# Patient Record
Sex: Female | Born: 1995 | Race: White | Hispanic: No | Marital: Married | Smoking: Never smoker
Health system: Southern US, Community
[De-identification: ages and names within clinical notes are randomized; demographics above are authoritative.]

## PROBLEM LIST (undated history)

## (undated) DIAGNOSIS — D649 Anemia, unspecified: Secondary | ICD-10-CM

## (undated) DIAGNOSIS — F419 Anxiety disorder, unspecified: Secondary | ICD-10-CM

## (undated) DIAGNOSIS — K219 Gastro-esophageal reflux disease without esophagitis: Secondary | ICD-10-CM

## (undated) HISTORY — PX: FOOT SURGERY: SHX648

## (undated) HISTORY — DX: Anxiety disorder, unspecified: F41.9

## (undated) HISTORY — DX: Gastro-esophageal reflux disease without esophagitis: K21.9

---

## 2017-12-23 DIAGNOSIS — O211 Hyperemesis gravidarum with metabolic disturbance: Secondary | ICD-10-CM | POA: Insufficient documentation

## 2018-01-01 DIAGNOSIS — Z8759 Personal history of other complications of pregnancy, childbirth and the puerperium: Secondary | ICD-10-CM | POA: Insufficient documentation

## 2020-02-24 DIAGNOSIS — N750 Cyst of Bartholin's gland: Secondary | ICD-10-CM | POA: Insufficient documentation

## 2020-12-06 ENCOUNTER — Other Ambulatory Visit: Payer: Self-pay

## 2021-11-25 ENCOUNTER — Emergency Department: Payer: Medicaid Other

## 2021-11-25 ENCOUNTER — Encounter: Payer: Self-pay | Admitting: Emergency Medicine

## 2021-11-25 ENCOUNTER — Emergency Department
Admission: EM | Admit: 2021-11-25 | Discharge: 2021-11-25 | Disposition: A | Discharge status: Home / Self Care | Payer: Medicaid Other | Attending: Student in an Organized Health Care Education/Training Program | Admitting: Student in an Organized Health Care Education/Training Program

## 2021-11-25 ENCOUNTER — Other Ambulatory Visit: Payer: Self-pay

## 2021-11-25 DIAGNOSIS — O219 Vomiting of pregnancy, unspecified: Secondary | ICD-10-CM | POA: Diagnosis not present

## 2021-11-25 DIAGNOSIS — E860 Dehydration: Secondary | ICD-10-CM | POA: Insufficient documentation

## 2021-11-25 DIAGNOSIS — Z3A01 Less than 8 weeks gestation of pregnancy: Secondary | ICD-10-CM | POA: Insufficient documentation

## 2021-11-25 HISTORY — DX: Anemia, unspecified: D64.9

## 2021-11-25 LAB — COMPREHENSIVE METABOLIC PANEL
ALT: 12 U/L (ref 0–44)
AST: 12 U/L — ABNORMAL LOW (ref 15–41)
Albumin: 4 g/dL (ref 3.5–5.0)
Alkaline Phosphatase: 46 U/L (ref 38–126)
Anion gap: 7 (ref 5–15)
BUN: 11 mg/dL (ref 6–20)
CO2: 23 mmol/L (ref 22–32)
Calcium: 8.9 mg/dL (ref 8.9–10.3)
Chloride: 101 mmol/L (ref 98–111)
Creatinine, Ser: 0.52 mg/dL (ref 0.44–1.00)
GFR, Estimated: >60 mL/min (ref >60)
Glucose, Bld: 94 mg/dL (ref 70–99)
Potassium: 3.8 mmol/L (ref 3.5–5.1)
Sodium: 131 mmol/L — ABNORMAL LOW (ref 135–145)
Total Bilirubin: 0.8 mg/dL (ref 0.3–1.2)
Total Protein: 7.3 g/dL (ref 6.5–8.1)

## 2021-11-25 LAB — URINALYSIS, ROUTINE W REFLEX MICROSCOPIC
Bacteria, UA: NONE SEEN
Bilirubin Urine: NEGATIVE
Glucose, UA: NEGATIVE mg/dL
Hgb urine dipstick: NEGATIVE
Ketones, ur: NEGATIVE mg/dL
Leukocytes,Ua: NEGATIVE
Nitrite: NEGATIVE
Protein, ur: NEGATIVE mg/dL
Specific Gravity, Urine: 1.023 (ref 1.005–1.030)
pH: 6 (ref 5.0–8.0)

## 2021-11-25 LAB — CBC
HCT: 35.3 % — ABNORMAL LOW (ref 36.0–46.0)
Hemoglobin: 11.7 g/dL — ABNORMAL LOW (ref 12.0–15.0)
MCH: 29 pg (ref 26.0–34.0)
MCHC: 33.1 g/dL (ref 30.0–36.0)
MCV: 87.6 fL (ref 80.0–100.0)
Platelets: 248 10*3/uL (ref 150–400)
RBC: 4.03 MIL/uL (ref 3.87–5.11)
RDW: 12.5 % (ref 11.5–15.5)
WBC: 9.8 10*3/uL (ref 4.0–10.5)
nRBC: 0 % (ref 0.0–0.2)

## 2021-11-25 LAB — LIPASE, BLOOD: Lipase: 24 U/L (ref 11–51)

## 2021-11-25 LAB — POC URINE PREG, ED: Preg Test, Ur: POSITIVE — AB

## 2021-11-25 LAB — HCG, QUANTITATIVE, PREGNANCY: hCG, Beta Chain, Quant, S: 59114 m[IU]/mL — ABNORMAL HIGH (ref <5)

## 2021-11-25 MED ORDER — SODIUM CHLORIDE 0.9 % IV BOLUS
1000.0000 mL | Freq: Once | INTRAVENOUS | Status: AC
  Administered 2021-11-25: 1000 mL via INTRAVENOUS

## 2021-11-25 MED ORDER — LACTATED RINGERS IV BOLUS
1000.0000 mL | Freq: Once | INTRAVENOUS | Status: AC
Start: 2021-11-25 — End: 2021-11-25
  Administered 2021-11-25: 1000 mL via INTRAVENOUS

## 2021-11-25 MED ORDER — DOXYLAMINE-PYRIDOXINE 10-10 MG PO TBEC: 1.0000 | DELAYED_RELEASE_TABLET | Freq: Two times a day (BID) | ORAL | 0 refills | Status: DC

## 2021-11-25 MED ORDER — ONDANSETRON HCL 4 MG/2ML IJ SOLN
4.0000 mg | Freq: Once | INTRAMUSCULAR | Status: AC
  Administered 2021-11-25: 4 mg via INTRAVENOUS
  Filled 2021-11-25: qty 2

## 2021-11-25 MED ORDER — ONDANSETRON 4 MG PO TBDP: 4.0000 mg | ORAL_TABLET | Freq: Three times a day (TID) | ORAL | 0 refills | Status: DC | PRN

## 2021-11-25 NOTE — ED Provider Notes (Signed)
[facility] Provider Note    Event Date/Time   First MD Initiated Contact with Patient 11/25/21 [id number]     (approximate)   History   Emesis and Diarrhea   HPI  Zenna Traister is a 26 y.o. female with a history of ectopic pregnancy presents to the ER for several days of nausea vomiting and dehydration.  Has a history of hyperemesis gravidarum.  Feels like similar symptoms.  She is uncertain as to how far along she is.  Typically has regular menses with her last menstrual cycle being at the end of November.  She is not having any fevers or chills denies any abdominal pain.     Physical Exam   Triage Vital Signs: ED Triage Vitals  Enc Vitals Group     BP 11/25/21 0859 125/66     Pulse Rate 11/25/21 0859 78     Resp 11/25/21 0859 20     Temp 11/25/21 0859 98.6 F (37 C)     Temp Source 11/25/21 0859 Oral     SpO2 11/25/21 0859 98 %     Weight 11/25/21 0854 145 lb (65.8 kg)     Height 11/25/21 0854 5\' 3"  (1.6 m)     Head Circumference --      Peak Flow --      Pain Score 11/25/21 0854 0     Pain Loc --      Pain Edu? --      Excl. in GC? --     Most recent vital signs: Vitals:   11/25/21 0859  BP: 125/66  Pulse: 78  Resp: 20  Temp: 98.6 F (37 C)  SpO2: 98%     General: Awake, no distress.  CV:  Good peripheral perfusion.  Resp:  Normal effort.  Abd:  No distention. Non tender Other:     ED Results / Procedures / Treatments   Labs (all labs ordered are listed, but only abnormal results are displayed) Labs Reviewed  COMPREHENSIVE METABOLIC PANEL - Abnormal; Notable for the following components:      Result Value   Sodium 131 (*)    AST 12 (*)    All other components within normal limits  CBC - Abnormal; Notable for the following components:   Hemoglobin 11.7 (*)    HCT 35.3 (*)    All other components within normal limits  URINALYSIS, ROUTINE W REFLEX MICROSCOPIC - Abnormal; Notable for the following components:   Color,  Urine YELLOW (*)    APPearance HAZY (*)    All other components within normal limits  HCG, QUANTITATIVE, PREGNANCY - Abnormal; Notable for the following components:   hCG, Beta Chain, Quant, S 59,114 (*)    All other components within normal limits  POC URINE PREG, ED - Abnormal; Notable for the following components:   Preg Test, Ur Positive (*)    All other components within normal limits  LIPASE, BLOOD     EKG     RADIOLOGY I personally reviewed all radiographic images ordered to evaluate for the above acute complaints and reviewed radiology reports and findings.  These findings were personally discussed with the patient.  Please see medical record for radiology report.    PROCEDURES:  Critical Care performed: No  Procedures   MEDICATIONS ORDERED IN ED: Medications  sodium chloride 0.9 % bolus 1,000 mL (0 mLs Intravenous Stopped 11/25/21 1137)  ondansetron (ZOFRAN) injection 4 mg (4 mg Intravenous Given 11/25/21 1028)  lactated ringers bolus  1,000 mL (1,000 mLs Intravenous New Bag/Given 11/25/21 1142)     IMPRESSION / MDM / ASSESSMENT AND PLAN / ED COURSE  I reviewed the triage vital signs and the nursing notes.                              Differential diagnosis includes, but is not limited to, hyperemesis, dehydration, electrolyte abnormality, viral illness, enteritis, appendicitis, UTI, Pilo, biliary pathology  Patient presenting with several days of nausea vomiting dehydration.  Has benign abdominal exam blood work is reassuring she is pregnant uncertain as to how far along we will give IV fluids as well as IV antiemetic.  Urinalysis does not show any sign of infection.  Her exam is not consistent with acute appendicitis or biliary pathology.  She does have a history of ectopic pregnancy and given her complex OB history I will order ultrasound to evaluate for IUP.  Clinical Course as of 11/25/21 1212  Sun Nov 25, 2021  1137 Patient's work-up is reassuring.  We  discussed the findings of her ultrasound.  She is requesting for local referral.  We will give her 1 more liter of fluid she is feeling some mild nausea but would like to try something p.o.  Do believe she will be stable and appropriate for outpatient follow-up. [PR]    Clinical Course User Index [PR] Willy Eddy, MD     FINAL CLINICAL IMPRESSION(S) / ED DIAGNOSES   Final diagnoses:  Nausea and vomiting in pregnancy     Rx / DC Orders   ED Discharge Orders          Ordered    Doxylamine-Pyridoxine 10-10 MG TBEC  2 times daily        11/25/21 1137    ondansetron (ZOFRAN-ODT) 4 MG disintegrating tablet  Every 8 hours PRN        11/25/21 1137             Note:  This document was prepared using Dragon voice recognition software and may include unintentional dictation errors.    Willy Eddy, MD 11/25/21 1212

## 2021-11-25 NOTE — ED Notes (Signed)
Confirmed w/ lab that hCG can be added.

## 2021-11-25 NOTE — ED Notes (Signed)
No signature pad available at d/c.

## 2021-11-25 NOTE — ED Notes (Signed)
Pt taken to US

## 2021-11-25 NOTE — ED Triage Notes (Signed)
Pt reports is pregnant but not sure how far along she is, pt has not yet had her first appointment. Pt reports has hyperemesis, reports hx of the same with first pregnancy. Pt states NV for 1 week, pt also states some diarrhea. Pt reports called her MD and asked for meds but they told her to come to the ED

## 2021-12-10 ENCOUNTER — Encounter: Payer: Medicaid Other | Admitting: Obstetrics and Gynecology

## 2021-12-19 ENCOUNTER — Encounter: Payer: Medicaid Other | Admitting: Obstetrics and Gynecology

## 2022-01-04 LAB — HM PAP SMEAR: HM Pap smear: NEGATIVE

## 2022-02-07 LAB — TSH: TSH: 4.7 (ref 0.41–5.90)

## 2022-04-30 DIAGNOSIS — D509 Iron deficiency anemia, unspecified: Secondary | ICD-10-CM | POA: Insufficient documentation

## 2022-04-30 DIAGNOSIS — O99019 Anemia complicating pregnancy, unspecified trimester: Secondary | ICD-10-CM | POA: Insufficient documentation

## 2022-04-30 LAB — IRON,TIBC AND FERRITIN PANEL
%SAT: 6
Iron: 31
TIBC: 500

## 2022-04-30 LAB — VITAMIN B12: Vitamin B-12: 135

## 2022-05-19 LAB — HM HIV SCREENING LAB: HM HIV Screening: NEGATIVE

## 2022-06-24 LAB — BASIC METABOLIC PANEL
BUN: 8 (ref 4–21)
CO2: 21 (ref 13–22)
Chloride: 104 (ref 99–108)
Creatinine: 0.6 (ref 0.5–1.1)
Glucose: 80
Potassium: 3.8 mEq/L (ref 3.5–5.1)
Sodium: 136 — AB (ref 137–147)

## 2022-06-24 LAB — COMPREHENSIVE METABOLIC PANEL
Albumin: 2.6 — AB (ref 3.5–5.0)
Calcium: 8.3 — AB (ref 8.7–10.7)
eGFR: 127

## 2022-06-24 LAB — HEPATIC FUNCTION PANEL
ALT: 10 U/L (ref 7–35)
AST: 14 (ref 13–35)
Alkaline Phosphatase: 61 (ref 25–125)
Bilirubin, Total: 0.5

## 2022-07-13 LAB — CBC AND DIFFERENTIAL
HCT: 35 — AB (ref 36–46)
Hemoglobin: 11.3 — AB (ref 12.0–16.0)
Platelets: 221 10*3/uL (ref 150–400)
WBC: 12.1

## 2022-07-13 LAB — CBC: RBC: 4.08 (ref 3.87–5.11)

## 2022-10-04 LAB — HM HEPATITIS C SCREENING LAB: HM Hepatitis Screen: NEGATIVE

## 2023-04-23 ENCOUNTER — Emergency Department
Admission: EM | Admit: 2023-04-23 | Discharge: 2023-04-24 | Disposition: A | Discharge status: Home / Self Care | Payer: 59 | Attending: Emergency Medicine | Admitting: Emergency Medicine

## 2023-04-23 ENCOUNTER — Emergency Department: Payer: 59

## 2023-04-23 ENCOUNTER — Other Ambulatory Visit: Payer: Self-pay

## 2023-04-23 DIAGNOSIS — R112 Nausea with vomiting, unspecified: Secondary | ICD-10-CM | POA: Diagnosis present

## 2023-04-23 DIAGNOSIS — E860 Dehydration: Secondary | ICD-10-CM | POA: Insufficient documentation

## 2023-04-23 DIAGNOSIS — D72829 Elevated white blood cell count, unspecified: Secondary | ICD-10-CM | POA: Diagnosis not present

## 2023-04-23 DIAGNOSIS — R197 Diarrhea, unspecified: Secondary | ICD-10-CM | POA: Diagnosis not present

## 2023-04-23 DIAGNOSIS — R1031 Right lower quadrant pain: Secondary | ICD-10-CM | POA: Insufficient documentation

## 2023-04-23 LAB — COMPREHENSIVE METABOLIC PANEL
ALT: 16 U/L (ref 0–44)
AST: 16 U/L (ref 15–41)
Albumin: 4.5 g/dL (ref 3.5–5.0)
Alkaline Phosphatase: 77 U/L (ref 38–126)
Anion gap: 9 (ref 5–15)
BUN: 22 mg/dL — ABNORMAL HIGH (ref 6–20)
CO2: 24 mmol/L (ref 22–32)
Calcium: 9.1 mg/dL (ref 8.9–10.3)
Chloride: 103 mmol/L (ref 98–111)
Creatinine, Ser: 0.77 mg/dL (ref 0.44–1.00)
GFR, Estimated: >60 mL/min (ref >60)
Glucose, Bld: 125 mg/dL — ABNORMAL HIGH (ref 70–99)
Potassium: 4 mmol/L (ref 3.5–5.1)
Sodium: 136 mmol/L (ref 135–145)
Total Bilirubin: 0.8 mg/dL (ref 0.3–1.2)
Total Protein: 8 g/dL (ref 6.5–8.1)

## 2023-04-23 LAB — HCG, QUANTITATIVE, PREGNANCY: hCG, Beta Chain, Quant, S: 1 m[IU]/mL (ref <5)

## 2023-04-23 LAB — CBC
HCT: 45.7 % (ref 36.0–46.0)
Hemoglobin: 14.8 g/dL (ref 12.0–15.0)
MCH: 29.1 pg (ref 26.0–34.0)
MCHC: 32.4 g/dL (ref 30.0–36.0)
MCV: 89.8 fL (ref 80.0–100.0)
Platelets: 304 10*3/uL (ref 150–400)
RBC: 5.09 MIL/uL (ref 3.87–5.11)
RDW: 12.4 % (ref 11.5–15.5)
WBC: 23.2 10*3/uL — ABNORMAL HIGH (ref 4.0–10.5)
nRBC: 0 % (ref 0.0–0.2)

## 2023-04-23 LAB — LIPASE, BLOOD: Lipase: 29 U/L (ref 11–51)

## 2023-04-23 MED ORDER — IOHEXOL 300 MG/ML  SOLN
100.0000 mL | Freq: Once | INTRAMUSCULAR | Status: AC | PRN
  Administered 2023-04-23: 100 mL via INTRAVENOUS

## 2023-04-23 MED ORDER — ONDANSETRON HCL 4 MG/2ML IJ SOLN
4.0000 mg | Freq: Once | INTRAMUSCULAR | Status: AC
  Administered 2023-04-23: 4 mg via INTRAVENOUS
  Filled 2023-04-23: qty 2

## 2023-04-23 MED ORDER — SODIUM CHLORIDE 0.9 % IV BOLUS
1000.0000 mL | Freq: Once | INTRAVENOUS | Status: AC
  Administered 2023-04-23: 1000 mL via INTRAVENOUS

## 2023-04-23 MED ORDER — ONDANSETRON 4 MG PO TBDP: 4.0000 mg | ORAL_TABLET | Freq: Three times a day (TID) | ORAL | 0 refills | Status: DC | PRN

## 2023-04-23 NOTE — ED Triage Notes (Signed)
Patient has had Nausea, Vomiting, and Diarrhea since 1500  today.  Her daughter has been sick for several days.  She had near syncopal episode prior to EMS arrival.  Patient received 4mg  zofran and 250 NS by EMS.

## 2023-04-23 NOTE — Discharge Instructions (Addendum)
Concern that you have a viral illness causing your nausea, vomiting and diarrhea.  It is importantly stay hydrated and drink plenty of fluids.  You can alternate Motrin and Tylenol for your body aches and not feeling well.  You are given a prescription for nausea medication with Zofran.  zofran (ondansetron) - nausea medication, take 1 tablet every 8 hours as needed for nausea/vomiting.

## 2023-04-23 NOTE — ED Provider Notes (Signed)
[facility] Provider Note    Event Date/Time   First MD Initiated Contact with Patient 04/23/23 2145     (approximate)   History   Nausea, Emesis, Near Syncope, and Diarrhea   HPI  Brenda Stanton is a 27 y.o. female with no significant past medical history who presents to the emergency department with vomiting, diarrhea and feeling like she is going to pass out.  Symptoms started today around 3 PM.  Multiple episodes of vomiting and copious amounts of watery diarrhea.  States that she has 2 children at home that have been sick with similar symptoms.  Mild abdominal pain and cramping.  Denies any dysuria, urinary urgency or frequency.  Currently breast-feeding.  No prior abdominal surgeries.  No rashes or wounds.  No recent antibiotic use.     Physical Exam   Triage Vital Signs: ED Triage Vitals  Enc Vitals Group     BP 04/23/23 2142 134/66     Pulse Rate 04/23/23 2142 76     Resp 04/23/23 2142 18     Temp 04/23/23 2142 98.9 F (37.2 C)     Temp Source 04/23/23 2142 Oral     SpO2 04/23/23 2142 98 %     Weight 04/23/23 2144 200 lb 6.4 oz (90.9 kg)     Height 04/23/23 2144 5\' 3"  (1.6 m)     Head Circumference --      Peak Flow --      Pain Score 04/23/23 2143 6     Pain Loc --      Pain Edu? --      Excl. in GC? --     Most recent vital signs: Vitals:   04/23/23 2142 04/23/23 2230  BP: 134/66 (!) 105/44  Pulse: 76 77  Resp: 18 17  Temp: 98.9 F (37.2 C)   SpO2: 98% 100%    Physical Exam Constitutional:      Appearance: She is well-developed.     Comments: Actively vomiting  HENT:     Head: Atraumatic.  Eyes:     Conjunctiva/sclera: Conjunctivae normal.  Cardiovascular:     Rate and Rhythm: Regular rhythm.  Pulmonary:     Effort: No respiratory distress.  Abdominal:     General: There is no distension.     Tenderness: There is abdominal tenderness (RLQ). There is no right CVA tenderness or left CVA tenderness.  Musculoskeletal:         General: Normal range of motion.     Cervical back: Normal range of motion.  Skin:    General: Skin is warm.     Capillary Refill: Capillary refill takes less than 2 seconds.  Neurological:     Mental Status: She is alert. Mental status is at baseline.      IMPRESSION / MDM / ASSESSMENT AND PLAN / ED COURSE  I reviewed the triage vital signs and the nursing notes.  Differential diagnosis including viral illness, gastroenteritis, appendicitis, pyelonephritis, urinary tract infection.  Low suspicion for C. difficile given no recent antibiotic use.  EKG showed normal sinus rhythm.  Normal intervals.  No chamber enlargement.  No significant ST elevation or depression.  No signs of acute ischemia or dysrhythmia.   RADIOLOGY I independently reviewed imaging, my interpretation of imaging: CT abdomen and pelvis with contrast -no signs of acute appendicitis.  Read as no acute findings.  Diarrheal illness.   Labs (all labs ordered are listed, but only abnormal results are displayed) Labs  interpreted as -    Labs Reviewed  CBC - Abnormal; Notable for the following components:      Result Value   WBC 23.2 (*)    All other components within normal limits  COMPREHENSIVE METABOLIC PANEL - Abnormal; Notable for the following components:   Glucose, Bld 125 (*)    BUN 22 (*)    All other components within normal limits  LIPASE, BLOOD  HCG, QUANTITATIVE, PREGNANCY      Significant leukocytosis which is likely in the setting of vomiting.  On repeat exam patient does have right lower quadrant abdominal tenderness to palpation so will add on a CT abdomen and pelvis with contrast to further evaluate for possible acute appendicitis.  Ongoing nausea and vomiting.  Given a second dose of IV Zofran.  Given and second liter of IV fluids.  No significant electrolyte abnormalities.  CT scan without findings of acute appendicitis.  Patient was likely with viral  gastroenteritis.   PROCEDURES:  Critical Care performed: No  Procedures  Patient's presentation is most consistent with acute presentation with potential threat to life or bodily function.   MEDICATIONS ORDERED IN ED: Medications  sodium chloride 0.9 % bolus 1,000 mL (1,000 mLs Intravenous New Bag/Given 04/23/23 2154)  ondansetron (ZOFRAN) injection 4 mg (4 mg Intravenous Given 04/23/23 2244)  sodium chloride 0.9 % bolus 1,000 mL (1,000 mLs Intravenous New Bag/Given 04/23/23 2243)  iohexol (OMNIPAQUE) 300 MG/ML solution 100 mL (100 mLs Intravenous Contrast Given 04/23/23 2320)    FINAL CLINICAL IMPRESSION(S) / ED DIAGNOSES   Final diagnoses:  Nausea vomiting and diarrhea  Dehydration     Rx / DC Orders   ED Discharge Orders          Ordered    Ambulatory Referral to Primary Care (Establish Care)        04/23/23 2354    ondansetron (ZOFRAN-ODT) 4 MG disintegrating tablet  Every 8 hours PRN        04/23/23 2354             Note:  This document was prepared using Dragon voice recognition software and may include unintentional dictation errors.   Corena Herter, MD 04/23/23 [phone]

## 2023-04-24 DIAGNOSIS — R112 Nausea with vomiting, unspecified: Secondary | ICD-10-CM | POA: Diagnosis not present

## 2023-04-24 MED ORDER — DIPHENHYDRAMINE HCL 50 MG/ML IJ SOLN
25.0000 mg | Freq: Once | INTRAMUSCULAR | Status: AC
  Administered 2023-04-24: 25 mg via INTRAMUSCULAR
  Filled 2023-04-24: qty 1

## 2023-06-12 ENCOUNTER — Encounter: Payer: Self-pay | Admitting: Physician Assistant

## 2023-06-12 ENCOUNTER — Ambulatory Visit (INDEPENDENT_AMBULATORY_CARE_PROVIDER_SITE_OTHER): Payer: 59 | Admitting: Physician Assistant

## 2023-06-12 VITALS — BP 98/62 | HR 80 | Ht 63.0 in | Wt 144.0 lb

## 2023-06-12 DIAGNOSIS — D529 Folate deficiency anemia, unspecified: Secondary | ICD-10-CM | POA: Diagnosis not present

## 2023-06-12 DIAGNOSIS — F419 Anxiety disorder, unspecified: Secondary | ICD-10-CM

## 2023-06-12 DIAGNOSIS — D72829 Elevated white blood cell count, unspecified: Secondary | ICD-10-CM | POA: Diagnosis not present

## 2023-06-12 DIAGNOSIS — R238 Other skin changes: Secondary | ICD-10-CM

## 2023-06-12 DIAGNOSIS — Z1321 Encounter for screening for nutritional disorder: Secondary | ICD-10-CM

## 2023-06-12 DIAGNOSIS — D509 Iron deficiency anemia, unspecified: Secondary | ICD-10-CM | POA: Diagnosis not present

## 2023-06-12 DIAGNOSIS — R5383 Other fatigue: Secondary | ICD-10-CM

## 2023-06-12 DIAGNOSIS — N61 Mastitis without abscess: Secondary | ICD-10-CM

## 2023-06-12 MED ORDER — SERTRALINE HCL 100 MG PO TABS
100.0000 mg | ORAL_TABLET | Freq: Every day | ORAL | 3 refills | Status: DC
Start: 2023-06-12 — End: 2023-07-16

## 2023-06-12 MED ORDER — DOXYCYCLINE HYCLATE 100 MG PO TABS
100.0000 mg | ORAL_TABLET | Freq: Two times a day (BID) | ORAL | 0 refills | Status: AC
Start: 2023-06-12 — End: 2023-06-19

## 2023-06-12 NOTE — Progress Notes (Signed)
Date:  06/12/2023   Name:  Brenda Stanton   DOB:  Jan 16, 1996   MRN:  [id number]   Chief Complaint: Establish Care, bloodwork  (Pt stated he WBC was elevated throughout her whole pregnancy, she stated as a baby/kid her WBC was elevated, was tested for leukemia and it was negative ), and ingrown hair (X5 years, redness, 2 on pain scale, feels uncomfortable, right side, near hip )  HPI Brenda Stanton is a very pleasant 27 year old female with a history of anemia and leukocytosis new to our practice today to establish care.  She states that she has had various levels of leukocytosis since childhood; most recently WBCs >23k at [facility] ED 04/23/23 with presumed viral gastroenteritis with no CT findings, seems like her baseline is about 12k.    History of multifactorial anemia including deficiencies in iron, B12, and folate.  Struggles to maintain compliance with multivitamins due to stomach upset, does not like the taste of Gummies, does not want to drink nutritional shakes due to concern for weight gain.  Endorses chronic fatigue.  Says she does not usually eat full meals, but rather she grazes throughout the day.  Recently diagnosed with mastitis of left breast 06/01/2023 through [facility] urgent care, but would like alternative to dicloxacillin due to difficulty maintaining compliance with 4 times daily dosing.  She is actively breast-feeding.  Desires refill of sertraline, preferably 90-day supplies.  Takes it mainly for anxiety  Additionally she has a papule in the right lower abdomen she'd like me to look at. Moderately tender to the touch, present for years now. Does not spontaneously drain anything.     Medication list has been reviewed and updated.  Current Meds  Medication Sig   doxycycline (VIBRA-TABS) 100 MG tablet Take 1 tablet (100 mg total) by mouth 2 (two) times daily for 7 days. Do not take with dairy. This medication INCREASES SUN SENSITIVITY so avoid direct sunlight.   ondansetron (ZOFRAN-ODT) 4  MG disintegrating tablet Take 1 tablet (4 mg total) by mouth every 8 (eight) hours as needed for nausea or vomiting.   [DISCONTINUED] Doxylamine-Pyridoxine 10-10 MG TBEC Take 1 tablet by mouth 2 (two) times daily. (Patient taking differently: Take 1 tablet by mouth in the morning, at noon, in the evening, and at bedtime. For 10 days)   [DISCONTINUED] sertraline (ZOLOFT) 100 MG tablet Take 100 mg by mouth daily.     Review of Systems  Constitutional:  Positive for fatigue. Negative for fever.  Respiratory:  Negative for chest tightness and shortness of breath.   Cardiovascular:  Negative for chest pain and palpitations.  Gastrointestinal:  Negative for abdominal pain.  Skin:  Positive for rash (mastitis).  Psychiatric/Behavioral:  The patient is nervous/anxious.     Patient Active Problem List   Diagnosis Date Noted   Anxiety 06/12/2023   Cyst of Bartholin's gland duct 02/24/2020   Hx of ectopic pregnancy 01/01/2018    Allergies  Allergen Reactions   Metoclopramide Rash and Other (See Comments)    Other: muscle twitching and spasms. Restlessness. Incoherent.    Other: muscle twitching and spasms. Restlessness. Incoherent.   Morphine Shortness Of Breath   Morphine And Codeine Shortness Of Breath   Prochlorperazine Other (See Comments)    Blood pressure drops, and patient becomes unresponsive  Hypotension   Promethazine Other (See Comments)    Immunization History  Administered Date(s) Administered   Influenza,inj,Quad PF,6+ Mos 12/18/2017, 08/25/2018   Influenza-Unspecified 08/25/2016   MMR 07/27/2018   Tdap  05/07/2018, 04/29/2022    Past Surgical History:  Procedure Laterality Date   FOOT SURGERY Left    childhood    Social History   Tobacco Use   Smoking status: Never   Smokeless tobacco: Never  Vaping Use   Vaping status: Never Used  Substance Use Topics   Alcohol use: Not Currently   Drug use: Never    Family History  Problem Relation Age of Onset    Hypertension Maternal Grandmother    Diabetes Maternal Grandfather    Lung cancer Paternal Grandmother         06/12/2023    1:49 PM  GAD 7 : Generalized Anxiety Score  Nervous, Anxious, on Edge 1  Control/stop worrying 1  Worry too much - different things 1  Trouble relaxing 0  Restless 0  Easily annoyed or irritable 1  Afraid - awful might happen 0  Total GAD 7 Score 4  Anxiety Difficulty Not difficult at all       06/12/2023    1:49 PM  Depression screen PHQ 2/9  Decreased Interest 0  Down, Depressed, Hopeless 0  PHQ - 2 Score 0  Altered sleeping 2  Tired, decreased energy 3  Change in appetite 3  Feeling bad or failure about yourself  0  Trouble concentrating 1  Moving slowly or fidgety/restless 0  Suicidal thoughts 0  PHQ-9 Score 9  Difficult doing work/chores Somewhat difficult    BP Readings from Last 3 Encounters:  06/12/23 98/62  04/24/23 105/60  11/25/21 (!) 125/59    Wt Readings from Last 3 Encounters:  06/12/23 144 lb (65.3 kg)  04/23/23 200 lb 6.4 oz (90.9 kg)  11/25/21 145 lb (65.8 kg)    BP 98/62   Pulse 80   Ht 5\' 3"  (1.6 m)   Wt 144 lb (65.3 kg)   SpO2 97%   BMI 25.51 kg/m   Physical Exam Vitals and nursing note reviewed.  Constitutional:      Appearance: Normal appearance.  Cardiovascular:     Rate and Rhythm: Normal rate and regular rhythm.     Heart sounds: No murmur heard.    No friction rub. No gallop.  Pulmonary:     Effort: Pulmonary effort is normal.     Breath sounds: Normal breath sounds.  Chest:     Comments: Deferred Abdominal:     General: There is no distension.  Musculoskeletal:        General: Normal range of motion.  Skin:    General: Skin is warm and dry.     Comments: 5 mm erythematous papule, slightly tender, with a mobile subcutaneous density. No drainage, does not appear infected.   Neurological:     Mental Status: She is alert and oriented to person, place, and time.     Gait: Gait is intact.   Psychiatric:        Mood and Affect: Mood and affect normal.     Recent Labs     Component Value Date/Time   NA 136 04/23/2023 2149   NA 136 (A) 06/24/2022 0000   K 4.0 04/23/2023 2149   CL 103 04/23/2023 2149   CO2 24 04/23/2023 2149   GLUCOSE 125 (H) 04/23/2023 2149   BUN 22 (H) 04/23/2023 2149   BUN 8 06/24/2022 0000   CREATININE 0.77 04/23/2023 2149   CALCIUM 9.1 04/23/2023 2149   PROT 8.0 04/23/2023 2149   ALBUMIN 4.5 04/23/2023 2149   AST 16 04/23/2023 2149  ALT 16 04/23/2023 2149   ALKPHOS 77 04/23/2023 2149   BILITOT 0.8 04/23/2023 2149   GFRNONAA >60 04/23/2023 2149    Lab Results  Component Value Date   WBC 23.2 (H) 04/23/2023   HGB 14.8 04/23/2023   HCT 45.7 04/23/2023   MCV 89.8 04/23/2023   PLT 304 04/23/2023   No results found for: "HGBA1C" No results found for: "CHOL", "HDL", "LDLCALC", "LDLDIRECT", "TRIG", "CHOLHDL" Lab Results  Component Value Date   TSH 4.70 02/07/2022     Assessment and Plan:  1. Leukocytosis, unspecified type Patient has not had a CBC as a healthy nonpregnant female in some time.  In other words, all recent leukocytoses were in the context of pregnancy or illness.  Repeat CBC today, refer to hematology if still elevated. - CBC with Differential/Platelet - Comprehensive metabolic panel  2. Iron deficiency anemia, unspecified iron deficiency anemia type Check CBC and iron labs - CBC with Differential/Platelet - Iron, TIBC and Ferritin Panel  3. Anemia due to folic acid deficiency, unspecified deficiency type Check CBC and B vitamins - CBC with Differential/Platelet - B12 and Folate Panel  4. Fatigue, unspecified type Check fatigue labs as below - CBC with Differential/Platelet - Comprehensive metabolic panel - TSH - B12 and Folate Panel - Iron, TIBC and Ferritin Panel - VITAMIN D 25 Hydroxy (Vit-D Deficiency, Fractures)  5. Encounter for vitamin deficiency screening Check for deficiencies in B12, B9, vitamin  D - B12 and Folate Panel - VITAMIN D 25 Hydroxy (Vit-D Deficiency, Fractures)  6. Mastitis Stop dicloxacillin, start doxycycline.  Advised of sun sensitivity. - doxycycline (VIBRA-TABS) 100 MG tablet; Take 1 tablet (100 mg total) by mouth 2 (two) times daily for 7 days. Do not take with dairy. This medication INCREASES SUN SENSITIVITY so avoid direct sunlight.  Dispense: 14 tablet; Refill: 0  7. Anxiety Refill sertraline as below. - sertraline (ZOLOFT) 100 MG tablet; Take 1 tablet (100 mg total) by mouth daily.  Dispense: 90 tablet; Refill: 3  8. Papule of skin Favor benign etiology, patient reassured. Possibly a small epidermal cyst or perhaps a small keloid. Unlikely to be a trapped hair after 5 years. Try warm compress twice daily for 7 days to see if this helps.   F/u TBD pending today's labs.    Alvester Morin, PA-C, DMSc, Nutritionist [facility] [phone]

## 2023-06-12 NOTE — Patient Instructions (Signed)
-  It was a pleasure to see you today! Please review your visit summary for helpful information -Lab results are usually available within 1-2 days and we will call once reviewed -I would encourage you to follow your care via MyChart where you can access lab results, notes, messages, and more -If you feel that we did a nice job today, please complete your after-visit survey and leave us a Google review! Your CMA today was Kieandra and your provider was Dan Waddell, PA-C, DMSc  

## 2023-06-17 ENCOUNTER — Other Ambulatory Visit: Payer: Self-pay | Admitting: Physician Assistant

## 2023-06-17 DIAGNOSIS — E559 Vitamin D deficiency, unspecified: Secondary | ICD-10-CM | POA: Insufficient documentation

## 2023-06-17 LAB — CBC WITH DIFFERENTIAL/PLATELET
Basophils Absolute: 0 10*3/uL (ref 0.0–0.2)
Basos: 1 %
EOS (ABSOLUTE): 0.1 10*3/uL (ref 0.0–0.4)
Eos: 1 %
Hematocrit: 40.2 % (ref 34.0–46.6)
Hemoglobin: 13.3 g/dL (ref 11.1–15.9)
Immature Grans (Abs): 0 10*3/uL (ref 0.0–0.1)
Immature Granulocytes: 0 %
Lymphocytes Absolute: 1.9 10*3/uL (ref 0.7–3.1)
Lymphs: 25 %
MCH: 29.1 pg (ref 26.6–33.0)
MCHC: 33.1 g/dL (ref 31.5–35.7)
MCV: 88 fL (ref 79–97)
Monocytes Absolute: 0.5 10*3/uL (ref 0.1–0.9)
Monocytes: 7 %
Neutrophils Absolute: 5.1 10*3/uL (ref 1.4–7.0)
Neutrophils: 66 %
Platelets: 267 10*3/uL (ref 150–450)
RBC: 4.57 x10E6/uL (ref 3.77–5.28)
RDW: 12.2 % (ref 11.7–15.4)
WBC: 7.7 10*3/uL (ref 3.4–10.8)

## 2023-06-17 LAB — IRON,TIBC AND FERRITIN PANEL
Ferritin: 69 ng/mL (ref 15–150)
Iron Saturation: 32 % (ref 15–55)
Iron: 86 ug/dL (ref 27–159)
Total Iron Binding Capacity: 269 ug/dL (ref 250–450)
UIBC: 183 ug/dL (ref 131–425)

## 2023-06-17 LAB — COMPREHENSIVE METABOLIC PANEL
ALT: 15 IU/L (ref 0–32)
AST: 14 IU/L (ref 0–40)
Albumin: 4.3 g/dL (ref 4.0–5.0)
Alkaline Phosphatase: 80 IU/L (ref 44–121)
BUN/Creatinine Ratio: 25 — ABNORMAL HIGH (ref 9–23)
BUN: 15 mg/dL (ref 6–20)
Bilirubin Total: <0.2 mg/dL (ref 0.0–1.2)
CO2: 23 mmol/L (ref 20–29)
Calcium: 9.1 mg/dL (ref 8.7–10.2)
Chloride: 103 mmol/L (ref 96–106)
Creatinine, Ser: 0.61 mg/dL (ref 0.57–1.00)
Globulin, Total: 2.5 g/dL (ref 1.5–4.5)
Glucose: 84 mg/dL (ref 70–99)
Potassium: 4.3 mmol/L (ref 3.5–5.2)
Sodium: 138 mmol/L (ref 134–144)
Total Protein: 6.8 g/dL (ref 6.0–8.5)
eGFR: 126 mL/min/{1.73_m2} (ref >59)

## 2023-06-17 LAB — TSH: TSH: 2.4 u[IU]/mL (ref 0.450–4.500)

## 2023-06-17 LAB — B12 AND FOLATE PANEL
Folate: 4.6 ng/mL (ref >3.0)
Vitamin B-12: 387 pg/mL (ref 232–1245)

## 2023-06-17 LAB — VITAMIN D 25 HYDROXY (VIT D DEFICIENCY, FRACTURES): Vit D, 25-Hydroxy: 19.7 ng/mL — ABNORMAL LOW (ref 30.0–100.0)

## 2023-06-17 MED ORDER — CHOLECALCIFEROL 1.25 MG (50000 UT) PO CAPS
50000.0000 [IU] | ORAL_CAPSULE | ORAL | 0 refills | Status: DC
Start: 2023-06-17 — End: 2023-07-16

## 2023-07-15 ENCOUNTER — Ambulatory Visit: Payer: 59 | Admitting: Physician Assistant

## 2023-07-16 ENCOUNTER — Ambulatory Visit (INDEPENDENT_AMBULATORY_CARE_PROVIDER_SITE_OTHER): Payer: 59 | Admitting: Physician Assistant

## 2023-07-16 ENCOUNTER — Encounter: Payer: Self-pay | Admitting: Physician Assistant

## 2023-07-16 ENCOUNTER — Other Ambulatory Visit: Payer: Self-pay | Admitting: Physician Assistant

## 2023-07-16 ENCOUNTER — Other Ambulatory Visit: Payer: Self-pay

## 2023-07-16 VITALS — BP 98/68 | HR 97 | Temp 98.0°F | Ht 63.0 in | Wt 149.0 lb

## 2023-07-16 DIAGNOSIS — F3340 Major depressive disorder, recurrent, in remission, unspecified: Secondary | ICD-10-CM | POA: Insufficient documentation

## 2023-07-16 DIAGNOSIS — F329 Major depressive disorder, single episode, unspecified: Secondary | ICD-10-CM | POA: Insufficient documentation

## 2023-07-16 DIAGNOSIS — F3342 Major depressive disorder, recurrent, in full remission: Secondary | ICD-10-CM | POA: Insufficient documentation

## 2023-07-16 DIAGNOSIS — R635 Abnormal weight gain: Secondary | ICD-10-CM

## 2023-07-16 DIAGNOSIS — R140 Abdominal distension (gaseous): Secondary | ICD-10-CM | POA: Diagnosis not present

## 2023-07-16 DIAGNOSIS — E559 Vitamin D deficiency, unspecified: Secondary | ICD-10-CM

## 2023-07-16 DIAGNOSIS — F321 Major depressive disorder, single episode, moderate: Secondary | ICD-10-CM | POA: Diagnosis not present

## 2023-07-16 DIAGNOSIS — F3341 Major depressive disorder, recurrent, in partial remission: Secondary | ICD-10-CM | POA: Insufficient documentation

## 2023-07-16 MED ORDER — BUPROPION HCL ER (SR) 150 MG PO TB12
150.0000 mg | ORAL_TABLET | Freq: Two times a day (BID) | ORAL | 2 refills | Status: DC
Start: 2023-07-16 — End: 2023-11-17

## 2023-07-16 MED ORDER — CHOLECALCIFEROL 1.25 MG (50000 UT) PO CAPS
50000.0000 [IU] | ORAL_CAPSULE | ORAL | 0 refills | Status: DC
Start: 2023-07-16 — End: 2023-08-18

## 2023-07-16 NOTE — Progress Notes (Signed)
Date:  07/16/2023   Name:  Brenda Stanton   DOB:  02-16-1996   MRN:  [id number]   Chief Complaint: Weight Gain (X 1 year, Gained 5 lbs since last visit, feels like weight gain is due to being bloated, passing gas, not constipated )  HPI Cecily presents today to discuss weight gain and abdominal bloating since our last visit 06/12/2023.  By our scale she is up about 5 pounds, which is consistent with her home scale measurements, though she is fluctuates a few pounds every couple days. Last labs were all normal except for vitamin D deficiency, for which she has not yet started her vitamin D supplement.  We also refilled her sertraline at that visit, which she is taking about every other day.  She is troubled by her weight gain and struggling with body image, says husband has been teasing her about it and several people have asked her if she is pregnant.  She has Nexplanon inserted about 1 year ago, and also took a pregnancy test yesterday which was negative.  She feels like she has increased gas production as well, but no constipation.  She believes sertraline increases her appetite. She states that she would almost rather stop the sertraline and deal with her depression/anxiety than continue the medication and deal with weight gain.  She has been on the sertraline for about a year now.   Medication list has been reviewed and updated.  Current Meds  Medication Sig   APPLE CIDER VINEGAR PO Take by mouth daily. gummies   Cholecalciferol 1.25 MG (50000 UT) capsule Take 1 capsule (50,000 Units total) by mouth once a week for 12 doses.   ondansetron (ZOFRAN-ODT) 4 MG disintegrating tablet Take 1 tablet (4 mg total) by mouth every 8 (eight) hours as needed for nausea or vomiting.   sertraline (ZOLOFT) 100 MG tablet Take 1 tablet (100 mg total) by mouth daily.     Review of Systems  Constitutional:  Positive for appetite change, fatigue and unexpected weight change. Negative for fever.  Respiratory:   Negative for chest tightness and shortness of breath.   Cardiovascular:  Negative for chest pain and palpitations.  Gastrointestinal:  Negative for abdominal pain.  Psychiatric/Behavioral:  Positive for decreased concentration and dysphoric mood. Negative for self-injury. The patient is not nervous/anxious.     Patient Active Problem List   Diagnosis Date Noted   Vitamin D deficiency 06/17/2023   Anxiety 06/12/2023   Cyst of Bartholin's gland duct 02/24/2020   Hx of ectopic pregnancy 01/01/2018    Allergies  Allergen Reactions   Metoclopramide Rash and Other (See Comments)    Other: muscle twitching and spasms. Restlessness. Incoherent.    Other: muscle twitching and spasms. Restlessness. Incoherent.   Morphine Shortness Of Breath   Morphine And Codeine Shortness Of Breath   Prochlorperazine Other (See Comments)    Blood pressure drops, and patient becomes unresponsive  Hypotension   Promethazine Other (See Comments)    Immunization History  Administered Date(s) Administered   Influenza,inj,Quad PF,6+ Mos 12/18/2017, 08/25/2018   Influenza-Unspecified 08/25/2016   MMR 07/27/2018   Tdap 05/07/2018, 04/29/2022    Past Surgical History:  Procedure Laterality Date   FOOT SURGERY Left    childhood    Social History   Tobacco Use   Smoking status: Never   Smokeless tobacco: Never  Vaping Use   Vaping status: Never Used  Substance Use Topics   Alcohol use: Not Currently   Drug use:  Never    Family History  Problem Relation Age of Onset   Hypertension Maternal Grandmother    Diabetes Maternal Grandfather    Lung cancer Paternal Grandmother         07/16/2023    2:19 PM 06/12/2023    1:49 PM  GAD 7 : Generalized Anxiety Score  Nervous, Anxious, on Edge 0 1  Control/stop worrying 0 1  Worry too much - different things 0 1  Trouble relaxing 1 0  Restless 0 0  Easily annoyed or irritable 1 1  Afraid - awful might happen 0 0  Total GAD 7 Score 2 4  Anxiety  Difficulty Not difficult at all Not difficult at all       07/16/2023    2:19 PM 06/12/2023    1:49 PM  Depression screen PHQ 2/9  Decreased Interest 1 0  Down, Depressed, Hopeless 0 0  PHQ - 2 Score 1 0  Altered sleeping 1 2  Tired, decreased energy 1 3  Change in appetite 3 3  Feeling bad or failure about yourself  1 0  Trouble concentrating 1 1  Moving slowly or fidgety/restless 0 0  Suicidal thoughts 0 0  PHQ-9 Score 8 9  Difficult doing work/chores Somewhat difficult Somewhat difficult    BP Readings from Last 3 Encounters:  07/16/23 98/68  06/12/23 98/62  04/24/23 105/60    Wt Readings from Last 3 Encounters:  07/16/23 149 lb (67.6 kg)  06/12/23 144 lb (65.3 kg)  04/23/23 200 lb 6.4 oz (90.9 kg)    BP 98/68   Pulse 97   Temp 98 F (36.7 C) (Oral)   Ht 5\' 3"  (1.6 m)   Wt 149 lb (67.6 kg)   SpO2 97%   BMI 26.39 kg/m   Physical Exam Vitals and nursing note reviewed.  Constitutional:      Appearance: Normal appearance.  Cardiovascular:     Rate and Rhythm: Normal rate and regular rhythm.     Heart sounds: No murmur heard.    No friction rub. No gallop.  Pulmonary:     Effort: Pulmonary effort is normal.     Breath sounds: Normal breath sounds.  Abdominal:     General: Abdomen is flat. Bowel sounds are normal. There is no distension.     Palpations: Abdomen is soft.     Tenderness: There is no abdominal tenderness.     Hernia: No hernia is present.     Recent Labs     Component Value Date/Time   NA 138 06/12/2023 1433   K 4.3 06/12/2023 1433   CL 103 06/12/2023 1433   CO2 23 06/12/2023 1433   GLUCOSE 84 06/12/2023 1433   GLUCOSE 125 (H) 04/23/2023 2149   BUN 15 06/12/2023 1433   CREATININE 0.61 06/12/2023 1433   CALCIUM 9.1 06/12/2023 1433   PROT 6.8 06/12/2023 1433   ALBUMIN 4.3 06/12/2023 1433   AST 14 06/12/2023 1433   ALT 15 06/12/2023 1433   ALKPHOS 80 06/12/2023 1433   BILITOT <0.2 06/12/2023 1433   GFRNONAA >60 04/23/2023 2149     Lab Results  Component Value Date   WBC 7.7 06/12/2023   HGB 13.3 06/12/2023   HCT 40.2 06/12/2023   MCV 88 06/12/2023   PLT 267 06/12/2023   No results found for: "HGBA1C" No results found for: "CHOL", "HDL", "LDLCALC", "LDLDIRECT", "TRIG", "CHOLHDL" Lab Results  Component Value Date   TSH 2.400 06/12/2023     Assessment and  Plan:  1. Weight gain TSH normal last month.  Could be related to increased appetite, also potentially SSRI induced weight gain, though she has been on this medication for about a year so it would be unusual to see weight gain now, but possible.  Patient willing to try tapering off sertraline, which she is already taking every other day.  Advised taking half tablet for the next 1 week, then stop.  This may help with her weight and appetite.  2. Abdominal bloating No true bloating appreciated on exam today, patient reassured.  May or may not be related to her vitamin D deficiency.  3. Moderate major depression (HCC) Discussed sertraline taper as above.  Once off sertraline, start bupropion as specified below.  Discussed common side effects, and patient may have added benefit of some weight loss on this medication.  Relatively safe in breast-feeding.  Emphasized the importance of vitamin D correction in treating depression, advised to begin high-dose vitamin D supplementation as soon as possible.  If still having depressive symptoms on bupropion and after replenishing vitamin D, consider starting fluoxetine which is relatively weight neutral.  - buPROPion (WELLBUTRIN SR) 150 MG 12 hr tablet; Take 1 tablet (150 mg total) by mouth 2 (two) times daily. First 3 days take only one tab/day. Day 4 and after: one tab twice daily at least 8 hours apart with second dose no later than 6 pm.  Dispense: 60 tablet; Refill: 2  4. Vitamin D deficiency Reminded patient to pick up her prescription for vitamin D and start taking it as soon as possible.  Informed it takes time to  replenish vitamin D.   F/u 56m OV, sooner if needed.  Recheck vitamin D at that visit   Alvester Morin, PA-C, DMSc, Nutritionist [facility] [phone]

## 2023-07-16 NOTE — Patient Instructions (Signed)
-  It was a pleasure to see you today! Please review your visit summary for helpful information -I would encourage you to follow your care via MyChart where you can access lab results, notes, messages, and more -If you feel that we did a nice job today, please complete your after-visit survey and leave us a Google review! Your CMA today was Kieandra and your provider was Dan Waddell, PA-C, DMSc -Please return for follow-up in about 2 months  

## 2023-07-23 ENCOUNTER — Ambulatory Visit: Payer: Self-pay

## 2023-07-23 NOTE — Telephone Encounter (Signed)
Chief Complaint: Medication Question Pertinent Negatives: Patient denies any symptoms Disposition: [] ED /[] Urgent Care (no appt availability in office) / [] Appointment(In office/virtual)/ []  [facility]/ [x] Home Care/ [] Refused Recommended Disposition /[] [place] [facility]/ []  Follow-up with PCP Additional Notes: Patient states she misunderstood the directions for starting her new Wellbutrin Rx and was calling for clarification. Directions for the medication was explained to patient in detail at this time. Patient verbalized understanding. All questions were answered during the call. Patient reported taking 2 doses and is feeling fine. Will send to provider as an FYI.  Summary: how to take medication   Patient called as he thinks she has been taking her medication buPROPion (WELLBUTRIN SR) 150 MG 12 hr tablet incorrectly. She has been taking 1 tablet 2x daily and she is now on day 3 and is confused as to what she should have been doing day 1 and 2. Please f/u with patient to advise on how she should take her meds     Reason for Disposition  Caller has medicine question only, adult not sick, AND triager answers question  Answer Assessment - Initial Assessment Questions 1. NAME of MEDICINE: "What medicine(s) are you calling about?"     buPROPion (WELLBUTRIN SR) 150 MG 12 hr  2. QUESTION: "What is your question?" (e.g., double dose of medicine, side effect)     How should I be taken the medication  3. PRESCRIBER: "Who prescribed the medicine?" Reason: if prescribed by specialist, call should be referred to that group.     Daniel, PA 4. SYMPTOMS: "Do you have any symptoms?" If Yes, ask: "What symptoms are you having?"  "How bad are the symptoms (e.g., mild, moderate, severe)     None  Protocols used: Medication Question Call-A-AH

## 2023-07-24 NOTE — Telephone Encounter (Signed)
Noted  KP 

## 2023-08-18 ENCOUNTER — Ambulatory Visit (INDEPENDENT_AMBULATORY_CARE_PROVIDER_SITE_OTHER): Payer: 59 | Admitting: Physician Assistant

## 2023-08-18 ENCOUNTER — Encounter: Payer: Self-pay | Admitting: Physician Assistant

## 2023-08-18 VITALS — BP 118/78 | HR 88 | Temp 98.1°F | Ht 63.0 in | Wt 149.0 lb

## 2023-08-18 DIAGNOSIS — Z0000 Encounter for general adult medical examination without abnormal findings: Secondary | ICD-10-CM

## 2023-08-18 DIAGNOSIS — E559 Vitamin D deficiency, unspecified: Secondary | ICD-10-CM

## 2023-08-18 MED ORDER — CHOLECALCIFEROL 1.25 MG (50000 UT) PO CAPS
50000.0000 [IU] | ORAL_CAPSULE | ORAL | 0 refills | Status: AC
Start: 2023-08-18 — End: 2023-11-04

## 2023-08-18 NOTE — Progress Notes (Signed)
Date:  08/18/2023   Name:  Brenda Stanton   DOB:  [birth year]   MRN:  [id number]   Chief Complaint: Annual Exam  HPI  Brenda Stanton is a 27 y.o. female who presents today for her Complete Annual Exam. She feels well. She reports exercising walking daily. She reports she is sleeping well. Breast complaints sore nipples does breastfeed but hurting more usual. Trying to wean her son off breast milk who is now 1y old.   Last Physical: unknown Last Dental Exam: 6 mo ago Last Eye Exam: 2019 Last Pap: 2023 NILM  A little stressed recently, in marriage counseling.   Health Maintenance Due  Topic Date Due   COVID-19 Vaccine (1 - 2023-24 season) Never done    Immunization History  Administered Date(s) Administered   Influenza,inj,Quad PF,6+ Mos 12/18/2017, 08/25/2018   Influenza-Unspecified 08/25/2016   MMR 07/27/2018   Tdap 05/07/2018, 04/29/2022         Medication list has been reviewed and updated.  Current Meds  Medication Sig   buPROPion (WELLBUTRIN SR) 150 MG 12 hr tablet Take 1 tablet (150 mg total) by mouth 2 (two) times daily. First 3 days take only one tab/day. Day 4 and after: one tab twice daily at least 8 hours apart with second dose no later than 6 pm.     Review of Systems  Constitutional:  Negative for appetite change, fatigue and unexpected weight change.  HENT:  Negative for hearing loss and mouth sores.   Eyes:  Negative for visual disturbance.  Respiratory:  Negative for cough, chest tightness, shortness of breath and wheezing.   Cardiovascular:  Negative for chest pain, palpitations and leg swelling.  Gastrointestinal:  Negative for abdominal distention, abdominal pain, blood in stool, constipation and diarrhea.  Endocrine: Negative for polydipsia, polyphagia and polyuria.  Genitourinary:  Negative for difficulty urinating, dysuria, hematuria, menstrual problem, vaginal bleeding and vaginal discharge.  Musculoskeletal:  Negative for arthralgias, back pain  and gait problem.  Skin:  Negative for rash and wound.  Neurological:  Negative for dizziness, syncope, weakness, numbness and headaches.  Psychiatric/Behavioral:  Negative for behavioral problems and sleep disturbance.     Patient Active Problem List   Diagnosis Date Noted   Moderate major depression (HCC) 07/16/2023   Vitamin D deficiency 06/17/2023   Anxiety 06/12/2023   Cyst of Bartholin's gland duct 02/24/2020   Hx of ectopic pregnancy 01/01/2018    Allergies  Allergen Reactions   Metoclopramide Rash and Other (See Comments)    Other: muscle twitching and spasms. Restlessness. Incoherent.    Other: muscle twitching and spasms. Restlessness. Incoherent.   Morphine Shortness Of Breath   Morphine And Codeine Shortness Of Breath   Prochlorperazine Other (See Comments)    Blood pressure drops, and patient becomes unresponsive  Hypotension   Promethazine Other (See Comments)    Immunization History  Administered Date(s) Administered   Influenza,inj,Quad PF,6+ Mos 12/18/2017, 08/25/2018   Influenza-Unspecified 08/25/2016   MMR 07/27/2018   Tdap 05/07/2018, 04/29/2022    Past Surgical History:  Procedure Laterality Date   FOOT SURGERY Left    childhood    Social History   Tobacco Use   Smoking status: Never   Smokeless tobacco: Never  Vaping Use   Vaping status: Never Used  Substance Use Topics   Alcohol use: Not Currently   Drug use: Never    Family History  Problem Relation Age of Onset   Hypertension Maternal Grandmother    Diabetes Maternal  Grandfather    Lung cancer Paternal Grandmother         08/18/2023    2:23 PM 07/16/2023    2:19 PM 06/12/2023    1:49 PM  GAD 7 : Generalized Anxiety Score  Nervous, Anxious, on Edge 0 0 1  Control/stop worrying 0 0 1  Worry too much - different things 0 0 1  Trouble relaxing 0 1 0  Restless 0 0 0  Easily annoyed or irritable 1 1 1   Afraid - awful might happen 0 0 0  Total GAD 7 Score 1 2 4   Anxiety  Difficulty Not difficult at all Not difficult at all Not difficult at all       08/18/2023    2:22 PM 07/16/2023    2:19 PM 06/12/2023    1:49 PM  Depression screen PHQ 2/9  Decreased Interest 0 1 0  Down, Depressed, Hopeless 0 0 0  PHQ - 2 Score 0 1 0  Altered sleeping 0 1 2  Tired, decreased energy 0 1 3  Change in appetite 0 3 3  Feeling bad or failure about yourself  1 1 0  Trouble concentrating 0 1 1  Moving slowly or fidgety/restless 0 0 0  Suicidal thoughts 0 0 0  PHQ-9 Score 1 8 9   Difficult doing work/chores Not difficult at all Somewhat difficult Somewhat difficult    BP Readings from Last 3 Encounters:  08/18/23 118/78  07/16/23 98/68  06/12/23 98/62    Wt Readings from Last 3 Encounters:  08/18/23 149 lb (67.6 kg)  07/16/23 149 lb (67.6 kg)  06/12/23 144 lb (65.3 kg)    BP 118/78   Pulse 88   Temp 98.1 F (36.7 C) (Oral)   Ht 5\' 3"  (1.6 m)   Wt 149 lb (67.6 kg)   SpO2 97%   BMI 26.39 kg/m   Physical Exam Vitals and nursing note reviewed. Exam conducted with a chaperone present Mariann Barter Person, CMA).  Constitutional:      Appearance: Normal appearance. She is well-groomed.  HENT:     Ears:     Comments: EAC clear bilaterally with good view of TM which is without effusion or erythema.     Nose: Nose normal.     Mouth/Throat:     Mouth: Mucous membranes are moist. No oral lesions.     Dentition: Normal dentition.     Pharynx: Uvula midline. No posterior oropharyngeal erythema.  Eyes:     General: Vision grossly intact.     Extraocular Movements: Extraocular movements intact.     Conjunctiva/sclera: Conjunctivae normal.     Pupils: Pupils are equal, round, and reactive to light.  Neck:     Thyroid: No thyroid mass or thyromegaly.  Cardiovascular:     Rate and Rhythm: Normal rate and regular rhythm.     Heart sounds: S1 normal and S2 normal. No murmur heard.    No friction rub. No gallop.     Comments: Pulses 2+ at radial, PT, DP bilaterally.  No carotid bruit. No peripheral edema Pulmonary:     Effort: Pulmonary effort is normal.     Breath sounds: Normal breath sounds.  Chest:     Comments: Breast exam typical for age without suspicious masses or lymphadenopathy. Bilateral nipple tenderness without skin changes or discharge. Mild to moderate breast asymmetry with left breast larger and lower compared to right; patient says this happened after her last pregnancy and gradually resolved when she stopped breast-feeding. Abdominal:  General: Bowel sounds are normal.     Palpations: Abdomen is soft. There is no mass.     Tenderness: There is no abdominal tenderness.  Genitourinary:    Comments: Deferred Musculoskeletal:     Comments: Full ROM with strength 5/5 bilateral upper and lower extremities  Lymphadenopathy:     Cervical: No cervical adenopathy.  Skin:    General: Skin is warm.     Capillary Refill: Capillary refill takes less than 2 seconds.     Findings: No lesion or rash.  Neurological:     Mental Status: She is alert and oriented to person, place, and time.     Cranial Nerves: Cranial nerves 2-12 are intact.     Gait: Gait is intact.  Psychiatric:        Mood and Affect: Mood and affect normal.        Behavior: Behavior normal.     Recent Labs     Component Value Date/Time   NA 138 06/12/2023 1433   K 4.3 06/12/2023 1433   CL 103 06/12/2023 1433   CO2 23 06/12/2023 1433   GLUCOSE 84 06/12/2023 1433   GLUCOSE 125 (H) 04/23/2023 2149   BUN 15 06/12/2023 1433   CREATININE 0.61 06/12/2023 1433   CALCIUM 9.1 06/12/2023 1433   PROT 6.8 06/12/2023 1433   ALBUMIN 4.3 06/12/2023 1433   AST 14 06/12/2023 1433   ALT 15 06/12/2023 1433   ALKPHOS 80 06/12/2023 1433   BILITOT <0.2 06/12/2023 1433   GFRNONAA >60 04/23/2023 2149    Lab Results  Component Value Date   WBC 7.7 06/12/2023   HGB 13.3 06/12/2023   HCT 40.2 06/12/2023   MCV 88 06/12/2023   PLT 267 06/12/2023   No results found for:  "HGBA1C" No results found for: "CHOL", "HDL", "LDLCALC", "LDLDIRECT", "TRIG", "CHOLHDL" Lab Results  Component Value Date   TSH 2.400 06/12/2023     Assessment and Plan:  1. Annual physical exam Seemingly healthy patient with no abnormalities on exam. Encouraged healthy lifestyle including regular physical activity and consumption of whole fruits and vegetables. Encouraged routine dental and eye exams. Vaccinations up to date. Recent labs normal.   Tender nipples likely from breastfeeding, encouraged her to wean.   Patient mentions some cosmetic concern for breast appearance, says she is worried they won't go back to pre-pregnancy appearance. Encouraged chest exercises to strengthen and support her suspensory ligaments.    2. Vitamin D deficiency Refill Vit D. Plan to recheck next time.  - Cholecalciferol 1.25 MG (50000 UT) capsule; Take 1 capsule (50,000 Units total) by mouth once a week for 12 doses.  Dispense: 12 capsule; Refill: 0   No follow-ups on file.    Alvester Morin, PA-C, DMSc, Nutritionist [facility] [phone]

## 2023-08-18 NOTE — Patient Instructions (Addendum)
-  It was a pleasure to see you today! Please review your visit summary for helpful information -I would encourage you to follow your care via MyChart where you can access lab results, notes, messages, and more -If you feel that we did a nice job today, please complete your after-visit survey and leave [place] a Google review! Your CMA today was Mariann Barter and your provider was Alvester Morin, PA-C, DMSc -Please return for follow-up in about 1 year

## 2023-08-21 ENCOUNTER — Other Ambulatory Visit: Payer: Self-pay

## 2023-08-21 DIAGNOSIS — Z13220 Encounter for screening for lipoid disorders: Secondary | ICD-10-CM

## 2023-08-22 ENCOUNTER — Encounter: Payer: Self-pay | Admitting: Physician Assistant

## 2023-08-22 DIAGNOSIS — E785 Hyperlipidemia, unspecified: Secondary | ICD-10-CM | POA: Insufficient documentation

## 2023-08-22 LAB — LIPID PANEL WITH LDL/HDL RATIO
Cholesterol, Total: 204 mg/dL — ABNORMAL HIGH (ref 100–199)
HDL: 52 mg/dL (ref >39)
LDL Chol Calc (NIH): 129 mg/dL — ABNORMAL HIGH (ref 0–99)
LDL/HDL Ratio: 2.5 {ratio} (ref 0.0–3.2)
Triglycerides: 130 mg/dL (ref 0–149)
VLDL Cholesterol Cal: 23 mg/dL (ref 5–40)

## 2023-09-15 ENCOUNTER — Encounter: Payer: Self-pay | Admitting: Physician Assistant

## 2023-09-15 ENCOUNTER — Ambulatory Visit (INDEPENDENT_AMBULATORY_CARE_PROVIDER_SITE_OTHER): Payer: 59 | Admitting: Physician Assistant

## 2023-09-15 VITALS — BP 122/76 | HR 103 | Temp 98.1°F | Ht 63.0 in | Wt 148.0 lb

## 2023-09-15 DIAGNOSIS — E559 Vitamin D deficiency, unspecified: Secondary | ICD-10-CM

## 2023-09-15 DIAGNOSIS — F3340 Major depressive disorder, recurrent, in remission, unspecified: Secondary | ICD-10-CM | POA: Diagnosis not present

## 2023-09-15 DIAGNOSIS — J069 Acute upper respiratory infection, unspecified: Secondary | ICD-10-CM | POA: Diagnosis not present

## 2023-09-15 NOTE — Progress Notes (Signed)
Date:  09/15/2023   Name:  Brenda Stanton   DOB:  03/16/1996   MRN:  [id number]   Chief Complaint: Depression, Vit D Def., and Cough (Negative covid )  Cough This is a new problem. The current episode started yesterday. The problem has been gradually worsening. The problem occurs every few minutes. The cough is Non-productive. Associated symptoms include headaches, nasal congestion and rhinorrhea. Treatments tried: nasal spray. The treatment provided mild relief.   [name] presents today for follow-up on depression and vitamin D deficiency.  She reports she is doing well with current dose of bupropion and would like to stay at this dose.  Depression seems well-managed, also compliant with vitamin D and due for recheck today.  Unrelated, she endorses 1 day URI symptoms as above, says her son is also sick.   Medication list has been reviewed and updated.  Current Meds  Medication Sig   APPLE CIDER VINEGAR PO Take by mouth daily. gummies   buPROPion (WELLBUTRIN SR) 150 MG 12 hr tablet Take 1 tablet (150 mg total) by mouth 2 (two) times daily. First 3 days take only one tab/day. Day 4 and after: one tab twice daily at least 8 hours apart with second dose no later than 6 pm.   Cholecalciferol 1.25 MG (50000 UT) capsule Take 1 capsule (50,000 Units total) by mouth once a week for 12 doses.   [DISCONTINUED] ondansetron (ZOFRAN-ODT) 4 MG disintegrating tablet Take 1 tablet (4 mg total) by mouth every 8 (eight) hours as needed for nausea or vomiting.     Review of Systems  HENT:  Positive for rhinorrhea.   Respiratory:  Positive for cough.   Neurological:  Positive for headaches.    Patient Active Problem List   Diagnosis Date Noted   Mild hyperlipidemia 08/22/2023   Recurrent major depressive disorder in remission (HCC) 07/16/2023   Vitamin D deficiency 06/17/2023   Anxiety 06/12/2023   Cyst of Bartholin's gland duct 02/24/2020   Hx of ectopic pregnancy 01/01/2018    Allergies   Allergen Reactions   Metoclopramide Rash and Other (See Comments)    Other: muscle twitching and spasms. Restlessness. Incoherent.    Other: muscle twitching and spasms. Restlessness. Incoherent.   Morphine Shortness Of Breath   Morphine And Codeine Shortness Of Breath   Prochlorperazine Other (See Comments)    Blood pressure drops, and patient becomes unresponsive  Hypotension   Promethazine Other (See Comments)    Immunization History  Administered Date(s) Administered   Influenza,inj,Quad PF,6+ Mos 12/18/2017, 08/25/2018   Influenza-Unspecified 08/25/2016   MMR 07/27/2018   Tdap 05/07/2018, 04/29/2022    Past Surgical History:  Procedure Laterality Date   FOOT SURGERY Left    childhood    Social History   Tobacco Use   Smoking status: Never   Smokeless tobacco: Never  Vaping Use   Vaping status: Never Used  Substance Use Topics   Alcohol use: Not Currently   Drug use: Never    Family History  Problem Relation Age of Onset   Hypertension Maternal Grandmother    Diabetes Maternal Grandfather    Lung cancer Paternal Grandmother         09/15/2023    2:25 PM 08/18/2023    2:23 PM 07/16/2023    2:19 PM 06/12/2023    1:49 PM  GAD 7 : Generalized Anxiety Score  Nervous, Anxious, on Edge 0 0 0 1  Control/stop worrying 0 0 0 1  Worry too much -  different things 0 0 0 1  Trouble relaxing 0 0 1 0  Restless 0 0 0 0  Easily annoyed or irritable 1 1 1 1   Afraid - awful might happen 0 0 0 0  Total GAD 7 Score 1 1 2 4   Anxiety Difficulty Not difficult at all Not difficult at all Not difficult at all Not difficult at all       09/15/2023    2:24 PM 08/18/2023    2:22 PM 07/16/2023    2:19 PM  Depression screen PHQ 2/9  Decreased Interest 0 0 1  Down, Depressed, Hopeless 0 0 0  PHQ - 2 Score 0 0 1  Altered sleeping 0 0 1  Tired, decreased energy 0 0 1  Change in appetite 0 0 3  Feeling bad or failure about yourself  1 1 1   Trouble concentrating 0 0 1   Moving slowly or fidgety/restless 0 0 0  Suicidal thoughts 0 0 0  PHQ-9 Score 1 1 8   Difficult doing work/chores Not difficult at all Not difficult at all Somewhat difficult    BP Readings from Last 3 Encounters:  09/15/23 122/76  08/18/23 118/78  07/16/23 98/68    Wt Readings from Last 3 Encounters:  09/15/23 148 lb (67.1 kg)  08/18/23 149 lb (67.6 kg)  07/16/23 149 lb (67.6 kg)    BP 122/76   Pulse (!) 103   Temp 98.1 F (36.7 C) (Oral)   Ht 5\' 3"  (1.6 m)   Wt 148 lb (67.1 kg)   SpO2 95%   BMI 26.22 kg/m   Physical Exam Vitals and nursing note reviewed.  Constitutional:      General: She is not in acute distress.    Appearance: Normal appearance.  HENT:     Right Ear: Tympanic membrane normal.     Left Ear: Tympanic membrane normal.     Ears:     Comments: EAC clear bilaterally with good view of TM which is without effusion or erythema.     Nose: Nose normal.     Comments: Sinuses nontender    Mouth/Throat:     Mouth: Mucous membranes are moist.     Pharynx: No oropharyngeal exudate or posterior oropharyngeal erythema.  Eyes:     Conjunctiva/sclera: Conjunctivae normal.     Pupils: Pupils are equal, round, and reactive to light.  Cardiovascular:     Rate and Rhythm: Normal rate and regular rhythm.     Heart sounds: No murmur heard.    No friction rub. No gallop.  Pulmonary:     Effort: Pulmonary effort is normal.     Breath sounds: Normal breath sounds. No wheezing, rhonchi or rales.  Abdominal:     General: There is no distension.  Musculoskeletal:        General: Normal range of motion.  Lymphadenopathy:     Cervical: Cervical adenopathy present.     Right cervical: Posterior cervical adenopathy present.     Left cervical: Posterior cervical adenopathy present.  Skin:    General: Skin is warm and dry.  Neurological:     Mental Status: She is alert and oriented to person, place, and time.     Gait: Gait is intact.  Psychiatric:        Mood and  Affect: Mood and affect normal.     Recent Labs     Component Value Date/Time   NA 138 06/12/2023 1433   K 4.3 06/12/2023 1433  CL 103 06/12/2023 1433   CO2 23 06/12/2023 1433   GLUCOSE 84 06/12/2023 1433   GLUCOSE 125 (H) 04/23/2023 2149   BUN 15 06/12/2023 1433   CREATININE 0.61 06/12/2023 1433   CALCIUM 9.1 06/12/2023 1433   PROT 6.8 06/12/2023 1433   ALBUMIN 4.3 06/12/2023 1433   AST 14 06/12/2023 1433   ALT 15 06/12/2023 1433   ALKPHOS 80 06/12/2023 1433   BILITOT <0.2 06/12/2023 1433   GFRNONAA >60 04/23/2023 2149    Lab Results  Component Value Date   WBC 7.7 06/12/2023   HGB 13.3 06/12/2023   HCT 40.2 06/12/2023   MCV 88 06/12/2023   PLT 267 06/12/2023   No results found for: "HGBA1C" Lab Results  Component Value Date   CHOL 204 (H) 08/21/2023   HDL 52 08/21/2023   LDLCALC 129 (H) 08/21/2023   TRIG 130 08/21/2023   Lab Results  Component Value Date   TSH 2.400 06/12/2023     Assessment and Plan:  1. Vitamin D deficiency Recheck vitamin D today.  Patient instructed to complete the prescription for high-dose vitamin D, then maintain with daily supplement of 1000 IU. - VITAMIN D 25 Hydroxy (Vit-D Deficiency, Fractures)  2. Recurrent major depressive disorder, in remission [facility]) Doing well with current dose of bupropion, continue.  3. Acute URI No concerning exam findings. Likely viral etiology. Discussed self-limited nature of viral illnesses and advised conservative measures including rest, fluids, and OTC cough/cold medications. Contact precautions advised to limit spread. Encouraged mask wearing and good hand hygiene especially before meals. Call if acutely worsening symptoms or if no improvement in 5 days    Return in about 6 months (around 03/15/2024) for OV f/u dep.    Alvester Morin, PA-C, DMSc, Nutritionist [facility] [phone]

## 2023-09-16 LAB — VITAMIN D 25 HYDROXY (VIT D DEFICIENCY, FRACTURES): Vit D, 25-Hydroxy: 45.5 ng/mL (ref 30.0–100.0)

## 2023-09-16 IMAGING — US US OB < 14 WEEKS - US OB TV
1 series · 14 of 28 positions shown · non-contrast
Comparison: None.

CLINICAL DATA: Nausea and vomiting.

EXAM:
OBSTETRIC <14 WK US AND TRANSVAGINAL OB US
TECHNIQUE: Both transabdominal and transvaginal ultrasound examinations were
performed for complete evaluation of the gestation as well as the
maternal uterus, adnexal regions, and pelvic cul-de-sac.
Transvaginal technique was performed to assess early pregnancy.

[Series 1: ob us · 14 of 131 slices shown]
[im 5/131]
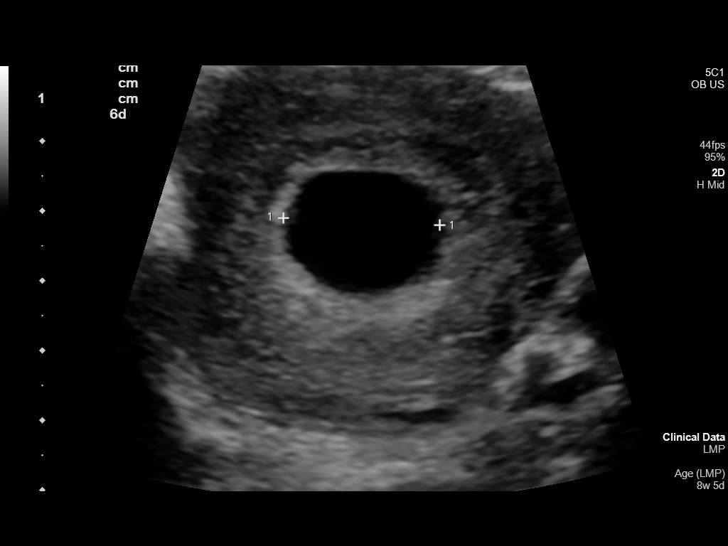
[im 15/131]
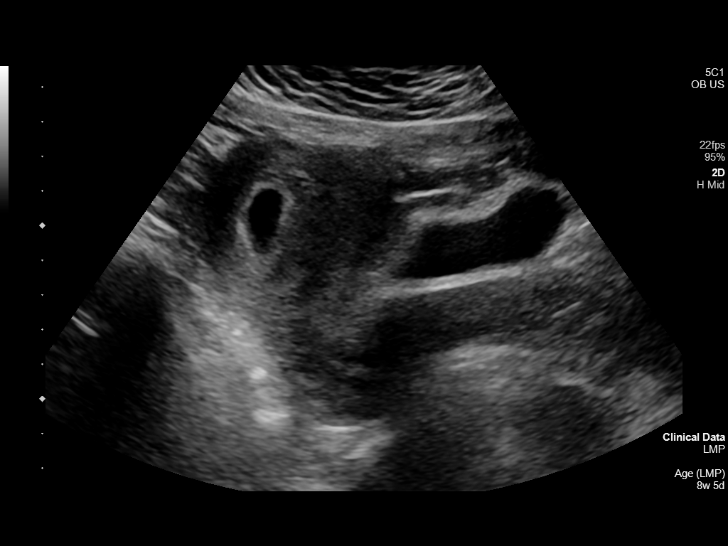
[im 25/131]
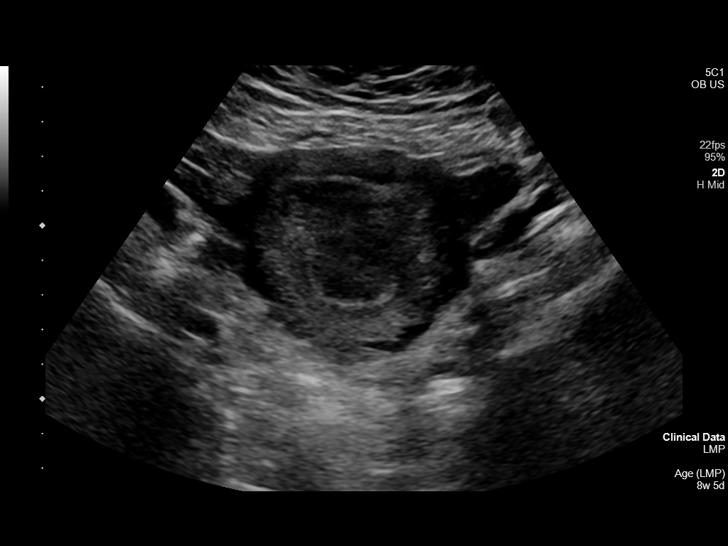
[im 34/131]
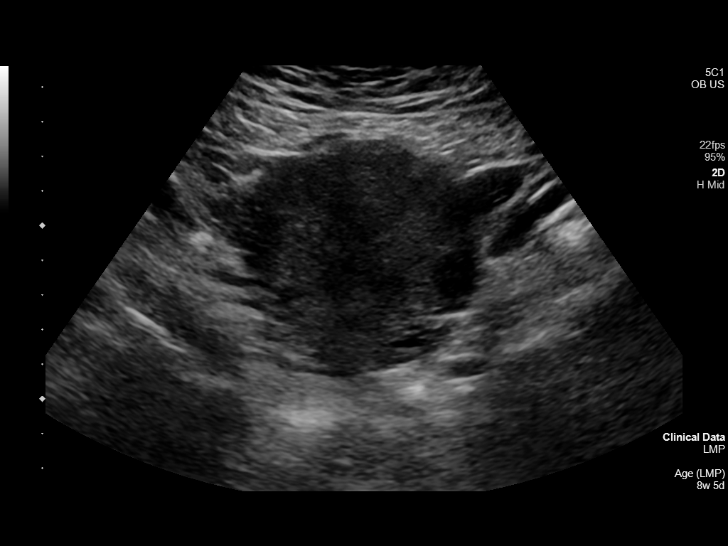
[im 44/131]
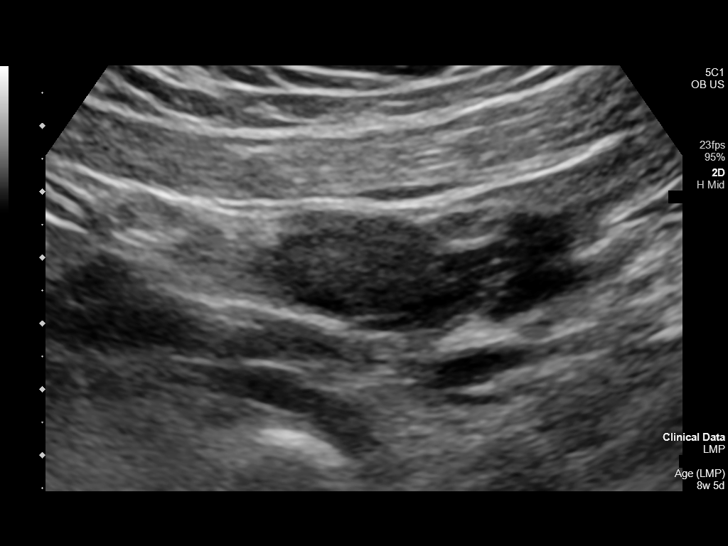
[im 53/131]
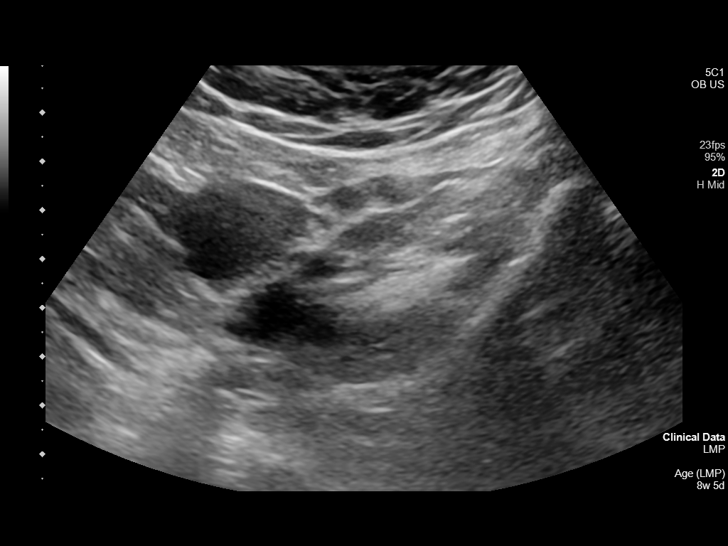
[im 63/131]
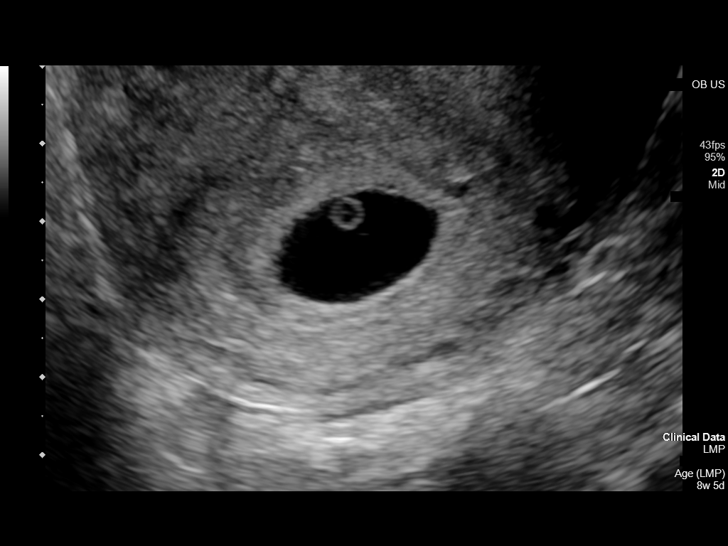
[im 73/131]
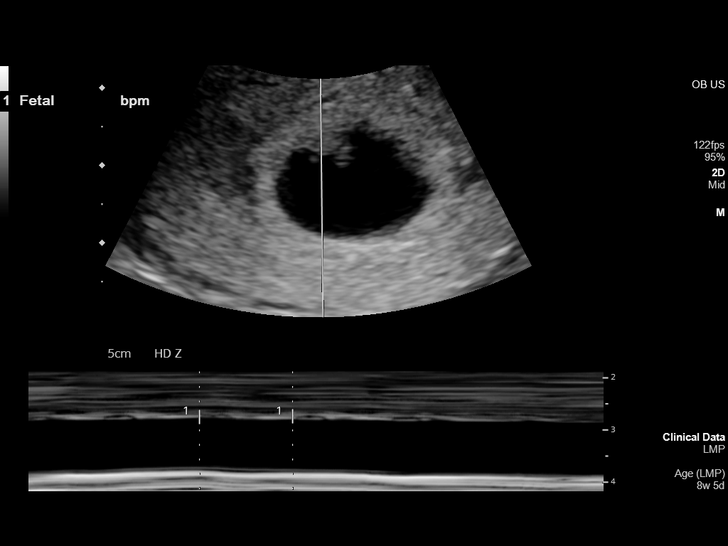
[im 82/131]
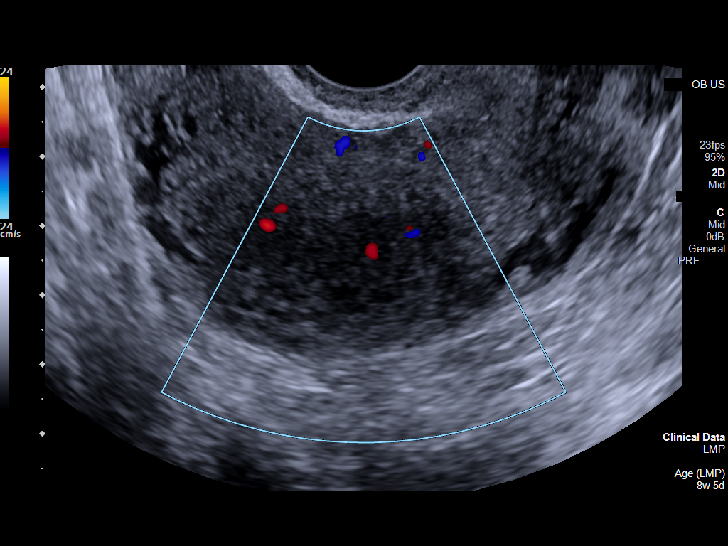
[im 92/131]
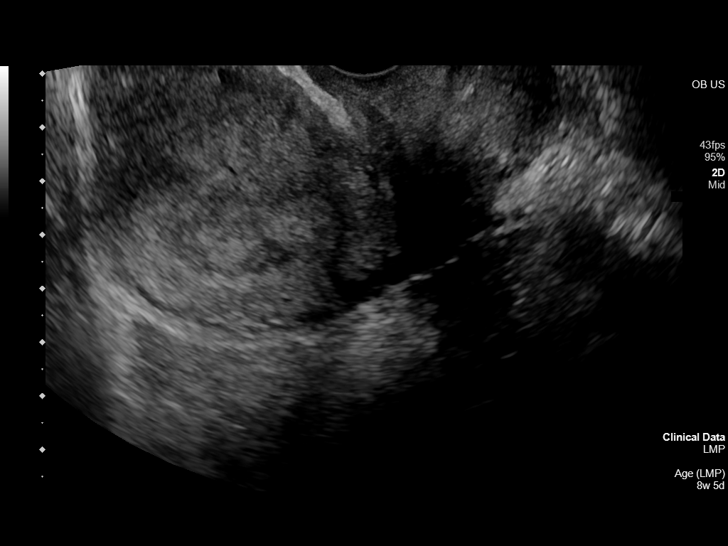
[im 102/131]
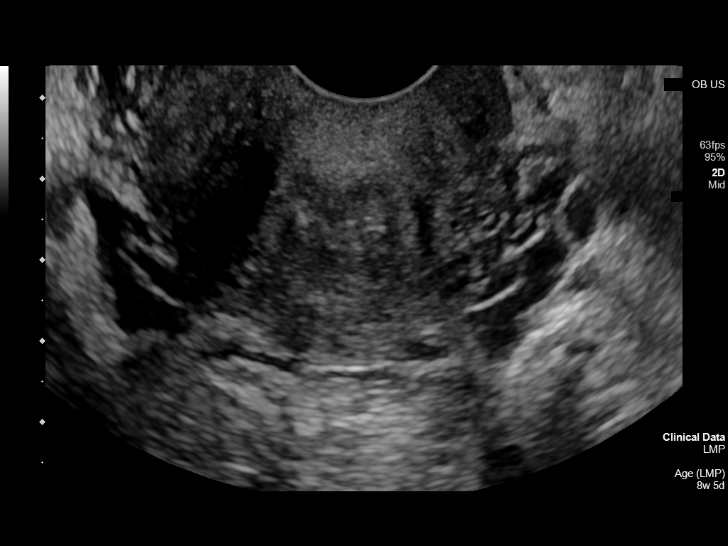
[im 111/131]
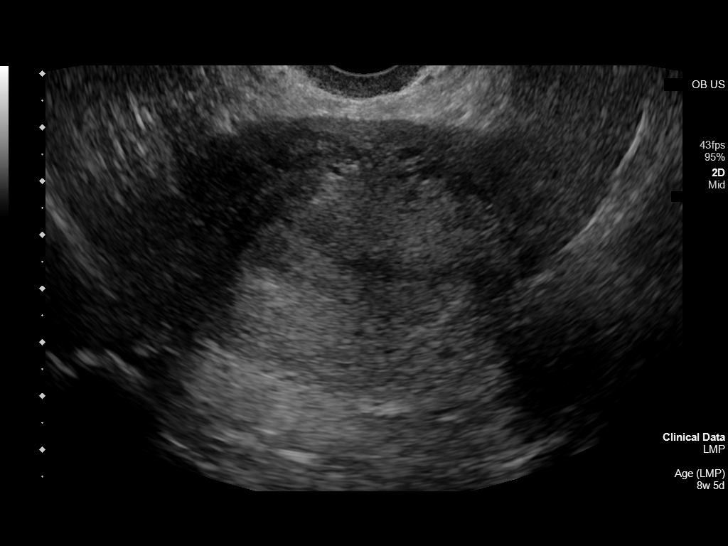
[im 121/131]
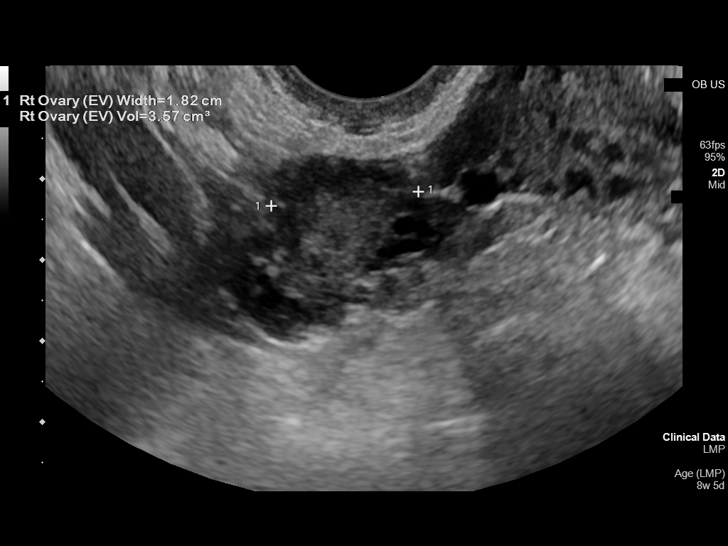
[im 131/131]
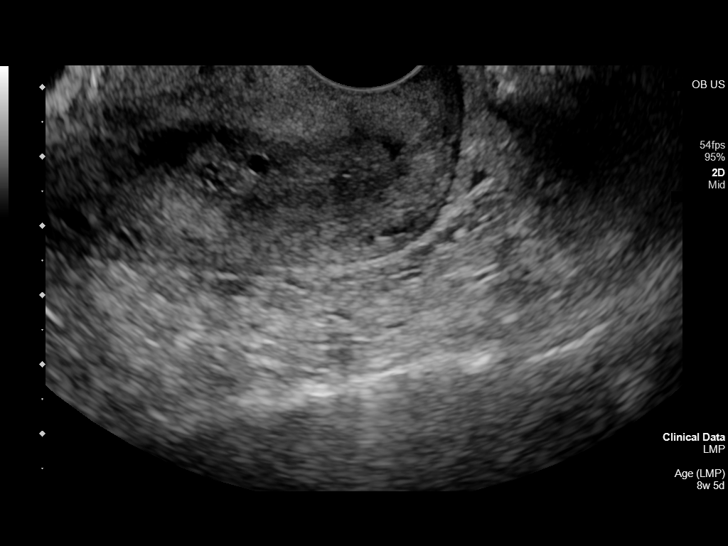

[14 of 28 positions shown; findings below may reference images not displayed]

FINDINGS: Intrauterine gestational sac: Single

Yolk sac:  Visualized

Embryo:  Visualized

Cardiac Activity: Visualized

Heart Rate: 110 bpm

MSD:   mm    w     d

CRL:  5 mm   6 w   2 d                  US EDC: July 19, 2022

Subchorionic hemorrhage: 3.5 x 1 x 1 cm small subchorionic
hemorrhage is noted.

Maternal uterus/adnexae: Bilateral ovaries are normal. Trace free
fluid is identified in the pelvis.
IMPRESSION: Single live intrauterine gestation measuring to 6 weeks and 2 days.
Small subchorionic hemorrhage is noted.

## 2023-11-17 ENCOUNTER — Other Ambulatory Visit: Payer: Self-pay | Admitting: Physician Assistant

## 2023-11-17 DIAGNOSIS — F321 Major depressive disorder, single episode, moderate: Secondary | ICD-10-CM

## 2024-02-23 ENCOUNTER — Ambulatory Visit (INDEPENDENT_AMBULATORY_CARE_PROVIDER_SITE_OTHER): Admitting: Physician Assistant

## 2024-02-23 VITALS — BP 120/80 | HR 80 | Temp 98.3°F | Ht 63.0 in | Wt 140.0 lb

## 2024-02-23 DIAGNOSIS — F411 Generalized anxiety disorder: Secondary | ICD-10-CM | POA: Diagnosis not present

## 2024-02-23 DIAGNOSIS — F332 Major depressive disorder, recurrent severe without psychotic features: Secondary | ICD-10-CM

## 2024-02-23 DIAGNOSIS — F410 Panic disorder [episodic paroxysmal anxiety] without agoraphobia: Secondary | ICD-10-CM | POA: Diagnosis not present

## 2024-02-23 MED ORDER — ALPRAZOLAM 0.25 MG PO TABS
0.2500 mg | ORAL_TABLET | Freq: Two times a day (BID) | ORAL | 1 refills | Status: AC | PRN
Start: 2024-02-23 — End: ?

## 2024-02-23 MED ORDER — FLUOXETINE HCL 20 MG PO TABS
20.0000 mg | ORAL_TABLET | Freq: Every day | ORAL | 1 refills | Status: DC
Start: 2024-02-23 — End: 2024-03-23

## 2024-02-23 NOTE — Progress Notes (Signed)
 Date:  02/23/2024   Name:  Brenda Stanton   DOB:  03-25-1996   MRN:  [id number]   Chief Complaint: Anxiety (Wellbutrin not helping, getting irritated, has ADHD wants medication )  HPI Brenda Stanton presents for worsening anxiety and depression, thinks bupropion is making her irritable and would like to consider alternative. She does feel like she has panic attacks at times. She endorses thoughts of nonfatal self-injury and sometimes passive SI.   Separately, she was previously diagnosed with ADHD in childhood but has not taken medication since middle school.    Medication list has been reviewed and updated.  Current Meds  Medication Sig   ALPRAZolam (XANAX) 0.25 MG tablet Take 1 tablet (0.25 mg total) by mouth 2 (two) times daily as needed for anxiety (only for acute anxiety/panic).   APPLE CIDER VINEGAR PO Take by mouth daily. gummies   FLUoxetine (PROZAC) 20 MG tablet Take 1 tablet (20 mg total) by mouth daily.   [DISCONTINUED] buPROPion (WELLBUTRIN SR) 150 MG 12 hr tablet Take 1 tablet (150 mg total) by mouth 2 (two) times daily. (Patient taking differently: Take 150 mg by mouth daily.)     Review of Systems  Patient Active Problem List   Diagnosis Date Noted   Generalized anxiety disorder with panic attacks 02/23/2024   Mild hyperlipidemia 08/22/2023   Major depressive disorder with current active episode 07/16/2023   Vitamin D deficiency 06/17/2023   Anxiety 06/12/2023   Cyst of Bartholin's gland duct 02/24/2020   Hx of ectopic pregnancy 01/01/2018    Allergies  Allergen Reactions   Metoclopramide Rash and Other (See Comments)    Other: muscle twitching and spasms. Restlessness. Incoherent.    Other: muscle twitching and spasms. Restlessness. Incoherent.   Morphine Shortness Of Breath   Morphine And Codeine Shortness Of Breath   Prochlorperazine Other (See Comments)    Blood pressure drops, and patient becomes unresponsive  Hypotension   Promethazine Other (See  Comments)    Immunization History  Administered Date(s) Administered   Influenza,inj,Quad PF,6+ Mos 12/18/2017, 08/25/2018   Influenza-Unspecified 08/25/2016   MMR 07/27/2018   Tdap 05/07/2018, 04/29/2022    Past Surgical History:  Procedure Laterality Date   FOOT SURGERY Left    childhood    Social History   Tobacco Use   Smoking status: Never   Smokeless tobacco: Never  Vaping Use   Vaping status: Never Used  Substance Use Topics   Alcohol use: Not Currently   Drug use: Never    Family History  Problem Relation Age of Onset   Hypertension Maternal Grandmother    Diabetes Maternal Grandfather    Lung cancer Paternal Grandmother         02/23/2024    4:16 PM 09/15/2023    2:25 PM 08/18/2023    2:23 PM 07/16/2023    2:19 PM  GAD 7 : Generalized Anxiety Score  Nervous, Anxious, on Edge 3 0 0 0  Control/stop worrying 1 0 0 0  Worry too much - different things 2 0 0 0  Trouble relaxing 2 0 0 1  Restless 2 0 0 0  Easily annoyed or irritable 3 1 1 1   Afraid - awful might happen 1 0 0 0  Total GAD 7 Score 14 1 1 2   Anxiety Difficulty Extremely difficult Not difficult at all Not difficult at all Not difficult at all       02/23/2024    4:15 PM 09/15/2023    2:24 PM  08/18/2023    2:22 PM  Depression screen PHQ 2/9  Decreased Interest 2 0 0  Down, Depressed, Hopeless 2 0 0  PHQ - 2 Score 4 0 0  Altered sleeping 3 0 0  Tired, decreased energy 2 0 0  Change in appetite 2 0 0  Feeling bad or failure about yourself  2 1 1   Trouble concentrating 3 0 0  Moving slowly or fidgety/restless 2 0 0  Suicidal thoughts 2 0 0  PHQ-9 Score 20 1 1   Difficult doing work/chores Extremely dIfficult Not difficult at all Not difficult at all    BP Readings from Last 3 Encounters:  02/23/24 120/80  09/15/23 122/76  08/18/23 118/78    Wt Readings from Last 3 Encounters:  02/23/24 140 lb (63.5 kg)  09/15/23 148 lb (67.1 kg)  08/18/23 149 lb (67.6 kg)    BP 120/80    Pulse 80   Temp 98.3 F (36.8 C)   Ht 5\' 3"  (1.6 m)   Wt 140 lb (63.5 kg)   SpO2 98%   Breastfeeding No   BMI 24.80 kg/m   Physical Exam Vitals and nursing note reviewed.  Constitutional:      Appearance: Normal appearance.  Cardiovascular:     Rate and Rhythm: Normal rate.  Pulmonary:     Effort: Pulmonary effort is normal.  Abdominal:     General: There is no distension.  Musculoskeletal:        General: Normal range of motion.  Skin:    General: Skin is warm and dry.  Neurological:     Mental Status: She is alert and oriented to person, place, and time.     Gait: Gait is intact.  Psychiatric:        Mood and Affect: Mood and affect normal.     Recent Labs     Component Value Date/Time   NA 138 06/12/2023 1433   K 4.3 06/12/2023 1433   CL 103 06/12/2023 1433   CO2 23 06/12/2023 1433   GLUCOSE 84 06/12/2023 1433   GLUCOSE 125 (H) 04/23/2023 2149   BUN 15 06/12/2023 1433   CREATININE 0.61 06/12/2023 1433   CALCIUM 9.1 06/12/2023 1433   PROT 6.8 06/12/2023 1433   ALBUMIN 4.3 06/12/2023 1433   AST 14 06/12/2023 1433   ALT 15 06/12/2023 1433   ALKPHOS 80 06/12/2023 1433   BILITOT <0.2 06/12/2023 1433   GFRNONAA >60 04/23/2023 2149    Lab Results  Component Value Date   WBC 7.7 06/12/2023   HGB 13.3 06/12/2023   HCT 40.2 06/12/2023   MCV 88 06/12/2023   PLT 267 06/12/2023   No results found for: "HGBA1C" Lab Results  Component Value Date   CHOL 204 (H) 08/21/2023   HDL 52 08/21/2023   LDLCALC 129 (H) 08/21/2023   TRIG 130 08/21/2023   Lab Results  Component Value Date   TSH 2.400 06/12/2023     Assessment and Plan:  Severe episode of recurrent major depressive disorder, without psychotic features (HCC) Assessment & Plan: Previously in partial remission but patient with significant worsening recently. Discussed options for management and ultimately settled on stopping bupropion and starting fluoxetine. Advised the fluoxetine will take time to  work, typically at least 1 week. Family history bipolar depression in mother.   Strongly encouraged counseling/therapy and demonstrated "Find a Therapist Tool." She will let me know if she needs a referral.   Orders: -     FLUoxetine HCl; Take  1 tablet (20 mg total) by mouth daily.  Dispense: 30 tablet; Refill: 1  Generalized anxiety disorder with panic attacks Assessment & Plan: Fluoxetine should help with this some. Also adding low dose alprazolam for as needed use. Mother with prior substance use disorder which we discussed today, will proceed with caution using controlled substances.   We discussed the importance of routine physical activity in reducing stress and anxiety.  May find benefit from yoga, mindfulness, and breathing techniques including box breathing as nonpharmacologic options for reducing anxiety.   Orders: -     ALPRAZolam; Take 1 tablet (0.25 mg total) by mouth 2 (two) times daily as needed for anxiety (only for acute anxiety/panic).  Dispense: 15 tablet; Refill: 1     Return in about 4 weeks (around 03/22/2024) for OV f/u dep/anx.    Alvester Morin, PA-C, DMSc, Nutritionist [facility] [phone]

## 2024-02-23 NOTE — Assessment & Plan Note (Signed)
>>  ASSESSMENT AND PLAN FOR MAJOR DEPRESSIVE DISORDER, RECURRENT EPISODE, IN PARTIAL REMISSION WRITTEN ON 02/23/2024  5:39 PM BY Arie Gable P, PA  Previously in partial remission but patient with significant worsening recently. Discussed options for management and ultimately settled on stopping bupropion  and starting fluoxetine . Advised the fluoxetine  will take time to work, typically at least 1 week. Family history bipolar depression in mother.   Strongly encouraged counseling/therapy and demonstrated Find a Therapist Tool. She will let me know if she needs a referral.

## 2024-02-23 NOTE — Assessment & Plan Note (Addendum)
 Previously in partial remission but patient with significant worsening recently. Discussed options for management and ultimately settled on stopping bupropion and starting fluoxetine. Advised the fluoxetine will take time to work, typically at least 1 week. Family history bipolar depression in mother.   Strongly encouraged counseling/therapy and demonstrated "Find a Therapist Tool." She will let me know if she needs a referral.

## 2024-02-23 NOTE — Telephone Encounter (Signed)
 Please review.  KP

## 2024-02-23 NOTE — Assessment & Plan Note (Addendum)
 Fluoxetine should help with this some. Also adding low dose alprazolam for as needed use. Mother with prior substance use disorder which we discussed today, will proceed with caution using controlled substances.   We discussed the importance of routine physical activity in reducing stress and anxiety.  May find benefit from yoga, mindfulness, and breathing techniques including box breathing as nonpharmacologic options for reducing anxiety.

## 2024-02-24 ENCOUNTER — Telehealth: Payer: Self-pay

## 2024-02-24 NOTE — Telephone Encounter (Signed)
 I called the pharmacy to authorize substitute for capsules.

## 2024-02-24 NOTE — Telephone Encounter (Signed)
 Copied from CRM [id number]. Topic: Clinical - Prescription Issue >> Feb 24, 2024  1:51 PM Ivette P wrote: Reason for CRM: insurance only covers the capsules and not the tablets. Calling to see if can be cleared to switch from tablets to capsules,   FLUoxetine (PROZAC) 20 MG tablet   Call back [phone]

## 2024-03-15 ENCOUNTER — Ambulatory Visit: Payer: Self-pay | Admitting: Physician Assistant

## 2024-03-23 ENCOUNTER — Ambulatory Visit (INDEPENDENT_AMBULATORY_CARE_PROVIDER_SITE_OTHER): Admitting: Physician Assistant

## 2024-03-23 ENCOUNTER — Encounter: Payer: Self-pay | Admitting: Physician Assistant

## 2024-03-23 DIAGNOSIS — F3341 Major depressive disorder, recurrent, in partial remission: Secondary | ICD-10-CM | POA: Diagnosis not present

## 2024-03-23 MED ORDER — FLUOXETINE HCL 20 MG PO CAPS
20.0000 mg | ORAL_CAPSULE | Freq: Every day | ORAL | 1 refills | Status: DC
Start: 2024-03-23 — End: 2024-08-18

## 2024-03-23 NOTE — Assessment & Plan Note (Signed)
>>  ASSESSMENT AND PLAN FOR MAJOR DEPRESSIVE DISORDER, RECURRENT EPISODE, IN PARTIAL REMISSION WRITTEN ON 03/23/2024 10:30 AM BY Glyndon Tursi P, PA  Much improved with fluoxetine , refill as a 90-day supply.  Will hold off on any pharmacotherapy for ADHD at this time, though we did discuss potential options to include Adderall (IR or ER) and guanfacine as initial choices of therapy if needed in several months.

## 2024-03-23 NOTE — Progress Notes (Signed)
 Date:  03/23/2024   Name:  Brenda Stanton   DOB:  06/02/1996   MRN:  [id number]   Chief Complaint: Depression and Anxiety  HPI Brenda Stanton presents for 1 month follow-up on depression and anxiety after stopping bupropion  and beginning fluoxetine  last visit.  She reports dramatic improvement on this medication ("I feel like a completely different person") and is taking it with good compliance and tolerance.  Last visit she was also given a small dispense of alprazolam  at a low dose for use only as needed for panic attacks, but she has not used it even once.   She continues to report some distractibility/lapses in concentration but endorses far better productivity compared to last month. Childhood ADHD.    Medication list has been reviewed and updated.  Current Meds  Medication Sig   ALPRAZolam  (XANAX ) 0.25 MG tablet Take 1 tablet (0.25 mg total) by mouth 2 (two) times daily as needed for anxiety (only for acute anxiety/panic).   APPLE CIDER VINEGAR PO Take by mouth daily. gummies   FLUoxetine  (PROZAC ) 20 MG capsule Take 1 capsule (20 mg total) by mouth daily.   [DISCONTINUED] FLUoxetine  (PROZAC ) 20 MG tablet Take 1 tablet (20 mg total) by mouth daily.     Review of Systems  Patient Active Problem List   Diagnosis Date Noted   Generalized anxiety disorder with panic attacks 02/23/2024   Mild hyperlipidemia 08/22/2023   Major depressive disorder, recurrent episode, in partial remission (HCC) 07/16/2023   Vitamin D  deficiency 06/17/2023   Anxiety 06/12/2023   Cyst of Bartholin's gland duct 02/24/2020   Hx of ectopic pregnancy 01/01/2018    Allergies  Allergen Reactions   Metoclopramide Rash and Other (See Comments)    Other: muscle twitching and spasms. Restlessness. Incoherent.    Other: muscle twitching and spasms. Restlessness. Incoherent.   Morphine Shortness Of Breath   Morphine And Codeine Shortness Of Breath   Prochlorperazine Other (See Comments)    Blood pressure drops,  and patient becomes unresponsive  Hypotension   Promethazine Other (See Comments)    Immunization History  Administered Date(s) Administered   Influenza,inj,Quad PF,6+ Mos 12/18/2017, 08/25/2018   Influenza-Unspecified 08/25/2016   MMR 07/27/2018   Tdap 05/07/2018, 04/29/2022    Past Surgical History:  Procedure Laterality Date   FOOT SURGERY Left    childhood    Social History   Tobacco Use   Smoking status: Never   Smokeless tobacco: Never  Vaping Use   Vaping status: Never Used  Substance Use Topics   Alcohol use: Not Currently   Drug use: Never    Family History  Problem Relation Age of Onset   Hypertension Maternal Grandmother    Diabetes Maternal Grandfather    Lung cancer Paternal Grandmother         03/23/2024   10:03 AM 02/23/2024    4:16 PM 09/15/2023    2:25 PM 08/18/2023    2:23 PM  GAD 7 : Generalized Anxiety Score  Nervous, Anxious, on Edge 0 3 0 0  Control/stop worrying 0 1 0 0  Worry too much - different things 0 2 0 0  Trouble relaxing 0 2 0 0  Restless 0 2 0 0  Easily annoyed or irritable 0 3 1 1   Afraid - awful might happen 0 1 0 0  Total GAD 7 Score 0 14 1 1   Anxiety Difficulty Not difficult at all Extremely difficult Not difficult at all Not difficult at all  03/23/2024   10:03 AM 02/23/2024    4:15 PM 09/15/2023    2:24 PM  Depression screen PHQ 2/9  Decreased Interest 0 2 0  Down, Depressed, Hopeless 0 2 0  PHQ - 2 Score 0 4 0  Altered sleeping 0 3 0  Tired, decreased energy 0 2 0  Change in appetite 0 2 0  Feeling bad or failure about yourself  0 2 1  Trouble concentrating 0 3 0  Moving slowly or fidgety/restless 0 2 0  Suicidal thoughts 0 2 0  PHQ-9 Score 0 20 1  Difficult doing work/chores Not difficult at all Extremely dIfficult Not difficult at all    BP Readings from Last 3 Encounters:  03/23/24 114/68  02/23/24 120/80  09/15/23 122/76    Wt Readings from Last 3 Encounters:  03/23/24 139 lb (63 kg)   02/23/24 140 lb (63.5 kg)  09/15/23 148 lb (67.1 kg)    BP 114/68   Pulse 67   Temp 98.4 F (36.9 C)   Ht 5\' 3"  (1.6 m)   Wt 139 lb (63 kg)   SpO2 99%   BMI 24.62 kg/m   Physical Exam Vitals and nursing note reviewed.  Constitutional:      Appearance: Normal appearance.  Cardiovascular:     Rate and Rhythm: Normal rate.  Pulmonary:     Effort: Pulmonary effort is normal.  Abdominal:     General: There is no distension.  Musculoskeletal:        General: Normal range of motion.  Skin:    General: Skin is warm and dry.  Neurological:     Mental Status: She is alert and oriented to person, place, and time.     Gait: Gait is intact.  Psychiatric:        Mood and Affect: Mood and affect normal.     Recent Labs     Component Value Date/Time   NA 138 06/12/2023 1433   K 4.3 06/12/2023 1433   CL 103 06/12/2023 1433   CO2 23 06/12/2023 1433   GLUCOSE 84 06/12/2023 1433   GLUCOSE 125 (H) 04/23/2023 2149   BUN 15 06/12/2023 1433   CREATININE 0.61 06/12/2023 1433   CALCIUM 9.1 06/12/2023 1433   PROT 6.8 06/12/2023 1433   ALBUMIN 4.3 06/12/2023 1433   AST 14 06/12/2023 1433   ALT 15 06/12/2023 1433   ALKPHOS 80 06/12/2023 1433   BILITOT <0.2 06/12/2023 1433   GFRNONAA >60 04/23/2023 2149    Lab Results  Component Value Date   WBC 7.7 06/12/2023   HGB 13.3 06/12/2023   HCT 40.2 06/12/2023   MCV 88 06/12/2023   PLT 267 06/12/2023   No results found for: "HGBA1C" Lab Results  Component Value Date   CHOL 204 (H) 08/21/2023   HDL 52 08/21/2023   LDLCALC 129 (H) 08/21/2023   TRIG 130 08/21/2023   Lab Results  Component Value Date   TSH 2.400 06/12/2023     Assessment and Plan:  Recurrent major depressive disorder, in early remission [facility]) Assessment & Plan: Much improved with fluoxetine , refill as a 90-day supply.  Will hold off on any pharmacotherapy for ADHD at this time, though we did discuss potential options to include Adderall (IR or ER) and  guanfacine as initial choices of therapy if needed in several months.  Orders: -     FLUoxetine  HCl; Take 1 capsule (20 mg total) by mouth daily.  Dispense: 90 capsule; Refill: 1  Return in about 3 months (around 06/22/2024) for OV f/u anx/dep.    Cody Das, PA-C, DMSc, Nutritionist [facility] Primary Care and Sports Medicine [facility] [phone]

## 2024-03-23 NOTE — Assessment & Plan Note (Signed)
 Much improved with fluoxetine , refill as a 90-day supply.  Will hold off on any pharmacotherapy for ADHD at this time, though we did discuss potential options to include Adderall (IR or ER) and guanfacine as initial choices of therapy if needed in several months.

## 2024-06-22 ENCOUNTER — Ambulatory Visit (INDEPENDENT_AMBULATORY_CARE_PROVIDER_SITE_OTHER): Admitting: Physician Assistant

## 2024-06-22 ENCOUNTER — Encounter: Payer: Self-pay | Admitting: Physician Assistant

## 2024-06-22 VITALS — BP 110/70 | HR 95 | Temp 97.8°F | Ht 63.0 in | Wt 144.0 lb

## 2024-06-22 DIAGNOSIS — R110 Nausea: Secondary | ICD-10-CM

## 2024-06-22 DIAGNOSIS — N921 Excessive and frequent menstruation with irregular cycle: Secondary | ICD-10-CM

## 2024-06-22 DIAGNOSIS — R350 Frequency of micturition: Secondary | ICD-10-CM

## 2024-06-22 DIAGNOSIS — N926 Irregular menstruation, unspecified: Secondary | ICD-10-CM | POA: Insufficient documentation

## 2024-06-22 DIAGNOSIS — Z975 Presence of (intrauterine) contraceptive device: Secondary | ICD-10-CM | POA: Diagnosis not present

## 2024-06-22 LAB — POCT URINALYSIS DIPSTICK
Bilirubin, UA: NEGATIVE
Blood, UA: NEGATIVE
Glucose, UA: NEGATIVE
Ketones, UA: NEGATIVE
Leukocytes, UA: NEGATIVE
Nitrite, UA: NEGATIVE
Protein, UA: NEGATIVE
Spec Grav, UA: 1.02 (ref 1.010–1.025)
Urobilinogen, UA: 0.2 U/dL
pH, UA: 6.5 (ref 5.0–8.0)

## 2024-06-22 LAB — POCT PREGNANCY, URINE

## 2024-06-22 MED ORDER — ONDANSETRON HCL 8 MG PO TABS: 8.0000 mg | ORAL_TABLET | Freq: Three times a day (TID) | ORAL | 2 refills | Status: AC | PRN

## 2024-06-22 NOTE — Progress Notes (Signed)
 Date:  06/22/2024   Name:  Brenda Stanton   DOB:  May 18, 1996   MRN:  [id number]   Chief Complaint: Medical Management of Chronic Issues and Fibroids (Has endometriosis, has nexplanon, bleeding heavy all the time, stops for a little bit and starts back, bloated, does not have GYN, sex hurts, can't empty bladder, urinary freq., lower back pain )  HPI Brenda Stanton presents today to discuss irregular menstrual bleeding over the last few months, but particularly worse in the last 3 weeks presently describing bleeding mostly every day with small breaks of 2 to 3 days followed by return of bleeding.  She has a Nexplanon that was placed roughly 2 years ago.  She also complains of nausea, bloating, dyspareunia, and lower abdominal pain.  She reports a history of endometriosis and fibroids, which is mentioned on previous notes from other providers, but on chart review of all imaging in the last 10 years, I see no mention of any abnormal uterine findings.  This includes CT abdomen and pelvis from just over 1 year ago and I have reviewed the images myself.  She complains of urinary concerns to include urinary frequency and incomplete voiding.  She desires UPT   Medication list has been reviewed and updated.  Current Meds  Medication Sig   ALPRAZolam  (XANAX ) 0.25 MG tablet Take 1 tablet (0.25 mg total) by mouth 2 (two) times daily as needed for anxiety (only for acute anxiety/panic).   FLUoxetine  (PROZAC ) 20 MG capsule Take 1 capsule (20 mg total) by mouth daily.   ondansetron  (ZOFRAN ) 8 MG tablet Take 1 tablet (8 mg total) by mouth every 8 (eight) hours as needed for nausea or vomiting.   [DISCONTINUED] APPLE CIDER VINEGAR PO Take by mouth daily. gummies   [DISCONTINUED] chlorhexidine (PERIDEX) 0.12 % solution      Review of Systems  Patient Active Problem List   Diagnosis Date Noted   Irregular menstrual bleeding 06/22/2024   Breakthrough bleeding on Nexplanon 06/22/2024   Generalized anxiety  disorder with panic attacks 02/23/2024   Mild hyperlipidemia 08/22/2023   Major depressive disorder, recurrent episode, in partial remission (HCC) 07/16/2023   Vitamin D  deficiency 06/17/2023   Anxiety 06/12/2023   Cyst of Bartholin's gland duct 02/24/2020   Hx of ectopic pregnancy 01/01/2018    Allergies  Allergen Reactions   Metoclopramide Rash and Other (See Comments)    Other: muscle twitching and spasms. Restlessness. Incoherent.    Other: muscle twitching and spasms. Restlessness. Incoherent.   Morphine Shortness Of Breath   Morphine And Codeine Shortness Of Breath   Prochlorperazine Other (See Comments)    Blood pressure drops, and patient becomes unresponsive  Hypotension   Promethazine Other (See Comments)    Immunization History  Administered Date(s) Administered   Influenza,inj,Quad PF,6+ Mos 12/18/2017, 08/25/2018   Influenza-Unspecified 08/25/2016   MMR 07/27/2018   Tdap 05/07/2018, 04/29/2022    Past Surgical History:  Procedure Laterality Date   FOOT SURGERY Left    childhood    Social History   Tobacco Use   Smoking status: Never   Smokeless tobacco: Never  Vaping Use   Vaping status: Never Used  Substance Use Topics   Alcohol use: Not Currently   Drug use: Never    Family History  Problem Relation Age of Onset   Hypertension Maternal Grandmother    Diabetes Maternal Grandfather    Lung cancer Paternal Grandmother         06/22/2024    9:23 AM  03/23/2024   10:03 AM 02/23/2024    4:16 PM 09/15/2023    2:25 PM  GAD 7 : Generalized Anxiety Score  Nervous, Anxious, on Edge 0 0 3 0  Control/stop worrying 0 0 1 0  Worry too much - different things 0 0 2 0  Trouble relaxing 0 0 2 0  Restless 0 0 2 0  Easily annoyed or irritable 0 0 3 1  Afraid - awful might happen 0 0 1 0  Total GAD 7 Score 0 0 14 1  Anxiety Difficulty Not difficult at all Not difficult at all Extremely difficult Not difficult at all       06/22/2024    9:23 AM  03/23/2024   10:03 AM 02/23/2024    4:15 PM  Depression screen PHQ 2/9  Decreased Interest 0 0 2  Down, Depressed, Hopeless 0 0 2  PHQ - 2 Score 0 0 4  Altered sleeping  0 3  Tired, decreased energy  0 2  Change in appetite  0 2  Feeling bad or failure about yourself   0 2  Trouble concentrating  0 3  Moving slowly or fidgety/restless  0 2  Suicidal thoughts  0 2  PHQ-9 Score  0 20  Difficult doing work/chores  Not difficult at all Extremely dIfficult    BP Readings from Last 3 Encounters:  06/22/24 110/70  03/23/24 114/68  02/23/24 120/80    Wt Readings from Last 3 Encounters:  06/22/24 144 lb (65.3 kg)  03/23/24 139 lb (63 kg)  02/23/24 140 lb (63.5 kg)    BP 110/70   Pulse 95   Temp 97.8 F (36.6 C)   Ht 5' 3 (1.6 m)   Wt 144 lb (65.3 kg)   SpO2 97%   BMI 25.51 kg/m   Physical Exam Vitals and nursing note reviewed.  Constitutional:      Appearance: Normal appearance.  Cardiovascular:     Rate and Rhythm: Normal rate and regular rhythm.     Heart sounds: No murmur heard.    No friction rub. No gallop.  Pulmonary:     Effort: Pulmonary effort is normal.     Breath sounds: Normal breath sounds.  Abdominal:     General: Bowel sounds are normal. There is no distension.     Palpations: Abdomen is soft.     Tenderness: There is abdominal tenderness in the right lower quadrant, suprapubic area and left lower quadrant.  Musculoskeletal:        General: Normal range of motion.  Skin:    General: Skin is warm and dry.  Neurological:     Mental Status: She is alert and oriented to person, place, and time.     Gait: Gait is intact.  Psychiatric:        Mood and Affect: Mood and affect normal.    Lab Results  Component Value Date   COLORU yellow 06/22/2024   CLARITYU clear 06/22/2024   GLUCOSEUR Negative 06/22/2024   BILIRUBINUR neg 06/22/2024   KETONESU neg 06/22/2024   SPECGRAV 1.020 06/22/2024   RBCUR neg 06/22/2024   PHUR 6.5 06/22/2024   PROTEINUR  Negative 06/22/2024   UROBILINOGEN 0.2 06/22/2024   LEUKOCYTESUR Negative 06/22/2024     Recent Labs     Component Value Date/Time   NA 138 06/12/2023 1433   K 4.3 06/12/2023 1433   CL 103 06/12/2023 1433   CO2 23 06/12/2023 1433   GLUCOSE 84 06/12/2023 1433  GLUCOSE 125 (H) 04/23/2023 2149   BUN 15 06/12/2023 1433   CREATININE 0.61 06/12/2023 1433   CALCIUM 9.1 06/12/2023 1433   PROT 6.8 06/12/2023 1433   ALBUMIN 4.3 06/12/2023 1433   AST 14 06/12/2023 1433   ALT 15 06/12/2023 1433   ALKPHOS 80 06/12/2023 1433   BILITOT <0.2 06/12/2023 1433   GFRNONAA >60 04/23/2023 2149    Lab Results  Component Value Date   WBC 7.7 06/12/2023   HGB 13.3 06/12/2023   HCT 40.2 06/12/2023   MCV 88 06/12/2023   PLT 267 06/12/2023   No results found for: HGBA1C Lab Results  Component Value Date   CHOL 204 (H) 08/21/2023   HDL 52 08/21/2023   LDLCALC 129 (H) 08/21/2023   TRIG 130 08/21/2023   Lab Results  Component Value Date   TSH 2.400 06/12/2023     Assessment and Plan:  1. Irregular menstrual bleeding (Primary) Could be endometriosis given the patient's reported history.  Will check routine labs today, including CBC to rule out anemia from this bleeding.  Referring to GYN for further evaluation, where she will likely get TVUS.  Reassured of negative pregnancy test today.  - Ambulatory referral to Gynecology - CBC with Differential/Platelet - Comprehensive metabolic panel with GFR - TSH  2. Breakthrough bleeding on Nexplanon - Ambulatory referral to Gynecology  3. Nausea Will prescribe Zofran  for symptomatic relief of nausea. - ondansetron  (ZOFRAN ) 8 MG tablet; Take 1 tablet (8 mg total) by mouth every 8 (eight) hours as needed for nausea or vomiting.  Dispense: 30 tablet; Refill: 2  4. Urinary frequency Urine completely clear today, no sign of UTI - POCT Urinalysis Dipstick - POCT Pregnancy, Urine - Ambulatory referral to Gynecology     F/u TBD   Rolan Hoyle, PA-C, DMSc, Nutritionist [facility] Primary Care and Sports Medicine [facility] [phone]

## 2024-06-23 ENCOUNTER — Ambulatory Visit: Payer: Self-pay | Admitting: Physician Assistant

## 2024-06-23 LAB — COMPREHENSIVE METABOLIC PANEL WITH GFR
ALT: 17 IU/L (ref 0–32)
AST: 19 IU/L (ref 0–40)
Albumin: 4.2 g/dL (ref 4.0–5.0)
Alkaline Phosphatase: 60 IU/L (ref 44–121)
BUN/Creatinine Ratio: 23 (ref 9–23)
BUN: 16 mg/dL (ref 6–20)
Bilirubin Total: 0.3 mg/dL (ref 0.0–1.2)
CO2: 21 mmol/L (ref 20–29)
Calcium: 9.2 mg/dL (ref 8.7–10.2)
Chloride: 101 mmol/L (ref 96–106)
Creatinine, Ser: 0.7 mg/dL (ref 0.57–1.00)
Globulin, Total: 2.4 g/dL (ref 1.5–4.5)
Glucose: 84 mg/dL (ref 70–99)
Potassium: 4.4 mmol/L (ref 3.5–5.2)
Sodium: 134 mmol/L (ref 134–144)
Total Protein: 6.6 g/dL (ref 6.0–8.5)
eGFR: 121 mL/min/1.73 (ref >59)

## 2024-06-23 LAB — CBC WITH DIFFERENTIAL/PLATELET
Basophils Absolute: 0 x10E3/uL (ref 0.0–0.2)
Basos: 1 %
EOS (ABSOLUTE): 0.1 x10E3/uL (ref 0.0–0.4)
Eos: 2 %
Hematocrit: 39.1 % (ref 34.0–46.6)
Hemoglobin: 12.6 g/dL (ref 11.1–15.9)
Immature Grans (Abs): 0 x10E3/uL (ref 0.0–0.1)
Immature Granulocytes: 0 %
Lymphocytes Absolute: 1.6 x10E3/uL (ref 0.7–3.1)
Lymphs: 25 %
MCH: 29.1 pg (ref 26.6–33.0)
MCHC: 32.2 g/dL (ref 31.5–35.7)
MCV: 90 fL (ref 79–97)
Monocytes Absolute: 0.6 x10E3/uL (ref 0.1–0.9)
Monocytes: 10 %
Neutrophils Absolute: 3.9 x10E3/uL (ref 1.4–7.0)
Neutrophils: 62 %
Platelets: 264 x10E3/uL (ref 150–450)
RBC: 4.33 x10E6/uL (ref 3.77–5.28)
RDW: 11.8 % (ref 11.7–15.4)
WBC: 6.2 x10E3/uL (ref 3.4–10.8)

## 2024-06-23 LAB — TSH: TSH: 1.97 u[IU]/mL (ref 0.450–4.500)

## 2024-07-29 ENCOUNTER — Encounter: Admitting: Obstetrics

## 2024-07-29 DIAGNOSIS — Z7689 Persons encountering health services in other specified circumstances: Secondary | ICD-10-CM

## 2024-07-29 DIAGNOSIS — Z975 Presence of (intrauterine) contraceptive device: Secondary | ICD-10-CM

## 2024-07-29 DIAGNOSIS — R350 Frequency of micturition: Secondary | ICD-10-CM

## 2024-08-18 ENCOUNTER — Ambulatory Visit (INDEPENDENT_AMBULATORY_CARE_PROVIDER_SITE_OTHER): Payer: Self-pay | Admitting: Physician Assistant

## 2024-08-18 ENCOUNTER — Encounter: Payer: Self-pay | Admitting: Physician Assistant

## 2024-08-18 VITALS — BP 118/78 | HR 78 | Temp 98.8°F | Ht 63.0 in

## 2024-08-18 DIAGNOSIS — F3342 Major depressive disorder, recurrent, in full remission: Secondary | ICD-10-CM

## 2024-08-18 DIAGNOSIS — Z0000 Encounter for general adult medical examination without abnormal findings: Secondary | ICD-10-CM | POA: Diagnosis not present

## 2024-08-18 DIAGNOSIS — N926 Irregular menstruation, unspecified: Secondary | ICD-10-CM

## 2024-08-18 MED ORDER — FLUOXETINE HCL 20 MG PO CAPS: 20.0000 mg | ORAL_CAPSULE | Freq: Every day | ORAL | 1 refills | Status: AC

## 2024-08-18 NOTE — Progress Notes (Addendum)
 Date:  08/18/2024   Name:  Brenda Stanton   DOB:  07/13/1996   MRN:  [id number]   Chief Complaint: Annual Exam  HPI Lassie returns to clinic for routine annual physical.  Doing well with current medications, though she does express some interest in eventually tapering off fluoxetine .  Now is not a good time to attempt this by her report as her child has recently started kindergarten and she is having ongoing marital concerns.  Last Physical: 08/18/23 Last Dental Exam: <6 mo ago Last Eye Exam: 2019 Last Pap: 2023 NILM  In July we had discussed irregular menstrual bleeding.  Labs were normal (no anemia) and she was referred to GYN where she initially had an appointment 07/29/2024 but this had to be rescheduled, now anticipated for 09/02/2024.   Medication list has been reviewed and updated.  Current Meds  Medication Sig   ALPRAZolam  (XANAX ) 0.25 MG tablet Take 1 tablet (0.25 mg total) by mouth 2 (two) times daily as needed for anxiety (only for acute anxiety/panic).   ondansetron  (ZOFRAN ) 8 MG tablet Take 1 tablet (8 mg total) by mouth every 8 (eight) hours as needed for nausea or vomiting.   [DISCONTINUED] FLUoxetine  (PROZAC ) 20 MG capsule Take 1 capsule (20 mg total) by mouth daily.     Review of Systems  Patient Active Problem List   Diagnosis Date Noted   Irregular menstrual bleeding 06/22/2024   Breakthrough bleeding on Nexplanon 06/22/2024   Generalized anxiety disorder with panic attacks 02/23/2024   Mild hyperlipidemia 08/22/2023   Recurrent major depressive disorder, in full remission 07/16/2023   Vitamin D  deficiency 06/17/2023   Anxiety 06/12/2023   Cyst of Bartholin's gland duct 02/24/2020   Hx of ectopic pregnancy 01/01/2018    Allergies  Allergen Reactions   Metoclopramide Rash and Other (See Comments)    Other: muscle twitching and spasms. Restlessness. Incoherent.    Other: muscle twitching and spasms. Restlessness. Incoherent.   Morphine Shortness Of  Breath   Morphine And Codeine Shortness Of Breath   Prochlorperazine Other (See Comments)    Blood pressure drops, and patient becomes unresponsive  Hypotension   Promethazine Other (See Comments)    Immunization History  Administered Date(s) Administered   HPV 9-valent 09/27/2015, 08/06/2016   Influenza,inj,Quad PF,6+ Mos 12/18/2017, 08/25/2018   Influenza-Unspecified 08/25/2016   MMR 07/27/2018   Tdap 05/07/2018, 04/29/2022    Past Surgical History:  Procedure Laterality Date   FOOT SURGERY Left    childhood    Social History   Tobacco Use   Smoking status: Never   Smokeless tobacco: Never  Vaping Use   Vaping status: Never Used  Substance Use Topics   Alcohol use: Not Currently   Drug use: Never    Family History  Problem Relation Age of Onset   Hypertension Maternal Grandmother    Diabetes Maternal Grandfather    Lung cancer Paternal Grandmother         08/18/2024    8:59 AM 06/22/2024    9:23 AM 03/23/2024   10:03 AM 02/23/2024    4:16 PM  GAD 7 : Generalized Anxiety Score  Nervous, Anxious, on Edge 0 0 0 3  Control/stop worrying 0 0 0 1  Worry too much - different things 0 0 0 2  Trouble relaxing 0 0 0 2  Restless 0 0 0 2  Easily annoyed or irritable 0 0 0 3  Afraid - awful might happen 0 0 0 1  Total GAD  7 Score 0 0 0 14  Anxiety Difficulty Not difficult at all Not difficult at all Not difficult at all Extremely difficult       08/18/2024    8:58 AM 06/22/2024    9:23 AM 03/23/2024   10:03 AM  Depression screen PHQ 2/9  Decreased Interest 0 0 0  Down, Depressed, Hopeless 0 0 0  PHQ - 2 Score 0 0 0  Altered sleeping   0  Tired, decreased energy   0  Change in appetite   0  Feeling bad or failure about yourself    0  Trouble concentrating   0  Moving slowly or fidgety/restless   0  Suicidal thoughts   0  PHQ-9 Score   0  Difficult doing work/chores   Not difficult at all    BP Readings from Last 3 Encounters:  08/18/24 118/78  06/22/24  110/70  03/23/24 114/68    Wt Readings from Last 3 Encounters:  06/22/24 144 lb (65.3 kg)  03/23/24 139 lb (63 kg)  02/23/24 140 lb (63.5 kg)    BP 118/78   Pulse 78   Temp 98.8 F (37.1 C)   Ht 5' 3 (1.6 m)   SpO2 98%   BMI 25.51 kg/m   Physical Exam Vitals and nursing note reviewed. Exam conducted with a chaperone present Clenton Person, CMA).  Constitutional:      Appearance: Normal appearance. She is well-groomed.  HENT:     Ears:     Comments: EAC clear bilaterally with good view of TM which is without effusion or erythema.     Nose: Nose normal.     Mouth/Throat:     Mouth: Mucous membranes are moist. No oral lesions.     Dentition: Normal dentition.     Pharynx: Uvula midline. No posterior oropharyngeal erythema.  Eyes:     General: Vision grossly intact.     Extraocular Movements: Extraocular movements intact.     Conjunctiva/sclera: Conjunctivae normal.     Pupils: Pupils are equal, round, and reactive to light.  Neck:     Thyroid: No thyroid mass or thyromegaly.  Cardiovascular:     Rate and Rhythm: Normal rate and regular rhythm.     Heart sounds: S1 normal and S2 normal. No murmur heard.    No friction rub. No gallop.     Comments: Pulses 2+ at radial, PT, DP bilaterally. No carotid bruit. No peripheral edema Pulmonary:     Effort: Pulmonary effort is normal.     Breath sounds: Normal breath sounds.  Chest:     Comments: Breast exam typical for age without suspicious masses or lymphadenopathy.  Small (less than 5 mm) mobile slightly tender cyst just lateral to the left nipple. Subtle breast asymmetry with left breast slightly lower than right. Abdominal:     General: Bowel sounds are normal.     Palpations: Abdomen is soft. There is no mass.     Tenderness: There is abdominal tenderness in the left lower quadrant.  Genitourinary:    Comments: Deferred Musculoskeletal:     Comments: Full ROM with strength 5/5 bilateral upper and lower extremities   Lymphadenopathy:     Cervical: No cervical adenopathy.  Skin:    General: Skin is warm.     Capillary Refill: Capillary refill takes less than 2 seconds.     Findings: No lesion or rash.  Neurological:     Mental Status: She is alert and oriented to person, place,  and time.     Cranial Nerves: Cranial nerves 2-12 are intact.     Gait: Gait is intact.  Psychiatric:        Mood and Affect: Mood and affect normal.        Behavior: Behavior normal.     Recent Labs     Component Value Date/Time   NA 134 06/22/2024 1013   K 4.4 06/22/2024 1013   CL 101 06/22/2024 1013   CO2 21 06/22/2024 1013   GLUCOSE 84 06/22/2024 1013   GLUCOSE 125 (H) 04/23/2023 2149   BUN 16 06/22/2024 1013   CREATININE 0.70 06/22/2024 1013   CALCIUM 9.2 06/22/2024 1013   PROT 6.6 06/22/2024 1013   ALBUMIN 4.2 06/22/2024 1013   AST 19 06/22/2024 1013   ALT 17 06/22/2024 1013   ALKPHOS 60 06/22/2024 1013   BILITOT 0.3 06/22/2024 1013   GFRNONAA >60 04/23/2023 2149    Lab Results  Component Value Date   WBC 6.2 06/22/2024   HGB 12.6 06/22/2024   HCT 39.1 06/22/2024   MCV 90 06/22/2024   PLT 264 06/22/2024   No results found for: HGBA1C Lab Results  Component Value Date   CHOL 204 (H) 08/21/2023   HDL 52 08/21/2023   LDLCALC 129 (H) 08/21/2023   TRIG 130 08/21/2023   Lab Results  Component Value Date   TSH 1.970 06/22/2024      Assessment and Plan:  1. Annual physical exam (Primary) Encouraged healthy lifestyle including regular physical activity and consumption of whole fruits and vegetables. Encouraged routine dental and eye exams. Vaccinations up to date.   Patient has a form that she will get to me at a later date for insurance premium discount.  Recent labs in July, should not need additional labs.  Patient says there is a spot for cholesterol on the form, but I will simply mark as not clinically necessary.  2. Recurrent major depressive disorder, in full remission Refill  fluoxetine  at the current dose.  Discussed that stopping this medication would require a taper.  Patient to let me know when she is ready to attempt a lower dose. - FLUoxetine  (PROZAC ) 20 MG capsule; Take 1 capsule (20 mg total) by mouth daily.  Dispense: 90 capsule; Refill: 1  3. Irregular menstrual bleeding Follow-up with GYN as scheduled for 09/02/2024.    Return in about 1 year (around 08/18/2025) for CPE.    Rolan Hoyle, PA-C, DMSc, Nutritionist [facility] Primary Care and Sports Medicine [facility] [phone]

## 2024-09-02 ENCOUNTER — Encounter: Payer: Self-pay | Admitting: Obstetrics

## 2024-09-02 ENCOUNTER — Ambulatory Visit: Admitting: Obstetrics

## 2024-09-02 VITALS — BP 108/67 | HR 66 | Ht 63.0 in | Wt 147.0 lb

## 2024-09-02 DIAGNOSIS — Z3046 Encounter for surveillance of implantable subdermal contraceptive: Secondary | ICD-10-CM | POA: Diagnosis not present

## 2024-09-02 DIAGNOSIS — N939 Abnormal uterine and vaginal bleeding, unspecified: Secondary | ICD-10-CM | POA: Diagnosis not present

## 2024-09-02 DIAGNOSIS — Z7689 Persons encountering health services in other specified circumstances: Secondary | ICD-10-CM

## 2024-09-02 NOTE — Progress Notes (Addendum)
 NEXPLANON REMOVAL  SUBJECTIVE Brenda Stanton is a 28 y.o. female who presents today for removal of her Nexplanon. Her Nexplanon was placed approximately 2 years ago. She reports that this contraceptive method is causing irregular bleeding, and she would like to have it removed. She has a possible h/o fibroids and endometriosis. She has been having abdominal bloating and dyspareunia and desires a pelvic US . She has used Depo in the past. She is done with childbearing. Her husband plans a vasectomy. She is not interested in other hormonal contraceptive methods at this time.  OBJECTIVE Vitals:   09/02/24 0902  BP: 108/67  Pulse: 66     Procedure Note Consent was obtained before beginning this procedure. The Nexplanon was palpated and the surrounding skin was prepped with iodine in sterile fashion. Adequate anesthesia was achieved with subdermal injection of 1% lidocaine. A skin incision was made over the distal aspect of the device. The capsule was lysed sharply and the device was removed with a hemostat. Hemostasis was achieved. The incision site was closed with with a steristrip and a pressure dressing was applied. Brenda Stanton tolerated the procedure well.  Standard post-procedure care and precautions were reviewed. We discussed the likelihood of heavy or painful periods d/t her history. Brenda Stanton verbalized understanding.  Pelvic US  ordered.   Brenda Stanton, CNM

## 2024-09-22 ENCOUNTER — Other Ambulatory Visit

## 2024-09-29 ENCOUNTER — Ambulatory Visit (INDEPENDENT_AMBULATORY_CARE_PROVIDER_SITE_OTHER)

## 2024-09-29 DIAGNOSIS — N939 Abnormal uterine and vaginal bleeding, unspecified: Secondary | ICD-10-CM

## 2024-10-04 ENCOUNTER — Telehealth: Payer: Self-pay

## 2024-10-04 ENCOUNTER — Encounter: Payer: Self-pay | Admitting: Certified Nurse Midwife

## 2024-10-04 NOTE — Telephone Encounter (Signed)
 Patient had u/s on Wednesday. She started having pain after her u/s. She describes pain as a dull ache, with lots of pressure. She has been bleeding since yesterday, bleeding is light not soaking a pad. She reports losing hair, fatigue and bloating. She would like to discuss results of the U/S.

## 2024-10-04 NOTE — Telephone Encounter (Signed)
 I attempted to call patient in regards to questions about her U/s. She did not answer the call. A message was left . I encouraged the patient to send any questions in my chart message so that I could address her questions.   Brenda Stanton, CNM

## 2024-10-04 NOTE — Telephone Encounter (Signed)
 FYI  KP

## 2024-10-05 NOTE — Telephone Encounter (Signed)
 Please review and advise.  JM

## 2024-10-06 ENCOUNTER — Encounter: Payer: Self-pay | Admitting: Physician Assistant

## 2024-10-06 ENCOUNTER — Ambulatory Visit (INDEPENDENT_AMBULATORY_CARE_PROVIDER_SITE_OTHER): Admitting: Physician Assistant

## 2024-10-06 VITALS — BP 102/66 | HR 83 | Temp 98.7°F | Ht 63.0 in | Wt 148.0 lb

## 2024-10-06 DIAGNOSIS — R140 Abdominal distension (gaseous): Secondary | ICD-10-CM | POA: Insufficient documentation

## 2024-10-06 DIAGNOSIS — E559 Vitamin D deficiency, unspecified: Secondary | ICD-10-CM | POA: Diagnosis not present

## 2024-10-06 MED ORDER — CHOLECALCIFEROL 1.25 MG (50000 UT) PO CAPS: 50000.0000 [IU] | ORAL_CAPSULE | ORAL | 1 refills | Status: AC

## 2024-10-06 NOTE — Progress Notes (Signed)
 Date:  10/06/2024   Name:  Brenda Stanton   DOB:  [birth year]   MRN:  [id number]   Chief Complaint: Bloated  HPI   Wileen presents to clinic today for evaluation of chronic episodic nausea, bloating, flatulence, and lower abdominal pain.  Bowel movements are variable, sometimes has pellets, other times loose stool/diarrhea, sometimes feels the need to stimulate the anus/rectum to provoke a bowel movement.  She is embarrassed when people ask if she is pregnant when bloated, and shows me a photo of a bad day which does seem to show moderate abdominal distention.  She has not been able to identify any specific triggers or patterns.  She has never seen a GI provider.  She recently completed pelvic ultrasound to evaluate her pelvic pain, irregular bleeding, and dyspareunia; the ultrasound was normal except for a 4 to 5 cm right ovarian cyst.  Patient would like to report that her mood seems to have improved since removing the Nexplanon, and she is thankful for that.  That said, she still struggles with self-esteem citing displeasure with her lower abdomen, chin, and weight in general.   Medication list has been reviewed and updated.  Current Meds  Medication Sig   ALPRAZolam  (XANAX ) 0.25 MG tablet Take 1 tablet (0.25 mg total) by mouth 2 (two) times daily as needed for anxiety (only for acute anxiety/panic).   Cholecalciferol  1.25 MG (50000 UT) capsule Take 1 capsule (50,000 Units total) by mouth once a week.   FLUoxetine  (PROZAC ) 20 MG capsule Take 1 capsule (20 mg total) by mouth daily.   ondansetron  (ZOFRAN ) 8 MG tablet Take 1 tablet (8 mg total) by mouth every 8 (eight) hours as needed for nausea or vomiting.     Review of Systems  Patient Active Problem List   Diagnosis Date Noted   Abdominal bloating 10/06/2024   Irregular menstrual bleeding 06/22/2024   Breakthrough bleeding on Nexplanon 06/22/2024   Generalized anxiety disorder with panic attacks 02/23/2024   Mild  hyperlipidemia 08/22/2023   Recurrent major depressive disorder, in full remission 07/16/2023   Vitamin D  deficiency 06/17/2023   Anxiety 06/12/2023   Cyst of Bartholin's gland duct 02/24/2020   Hx of ectopic pregnancy 01/01/2018    Allergies  Allergen Reactions   Metoclopramide Rash and Other (See Comments)    Other: muscle twitching and spasms. Restlessness. Incoherent.    Other: muscle twitching and spasms. Restlessness. Incoherent.   Morphine Shortness Of Breath   Morphine And Codeine Shortness Of Breath   Prochlorperazine Other (See Comments)    Blood pressure drops, and patient becomes unresponsive  Hypotension   Promethazine Other (See Comments)    Immunization History  Administered Date(s) Administered   HPV 9-valent 09/27/2015, 08/06/2016   Influenza,inj,Quad PF,6+ Mos 12/18/2017, 08/25/2018   Influenza-Unspecified 08/25/2016   MMR 07/27/2018   Tdap 05/07/2018, 04/29/2022    Past Surgical History:  Procedure Laterality Date   FOOT SURGERY Left    childhood    Social History   Tobacco Use   Smoking status: Never   Smokeless tobacco: Never  Vaping Use   Vaping status: Never Used  Substance Use Topics   Alcohol use: Not Currently   Drug use: Never    Family History  Problem Relation Age of Onset   Hypertension Maternal Grandmother    Diabetes Maternal Grandfather    Lung cancer Paternal Grandmother         10/06/2024    3:17 PM 08/18/2024    8:59 AM  06/22/2024    9:23 AM 03/23/2024   10:03 AM  GAD 7 : Generalized Anxiety Score  Nervous, Anxious, on Edge 0 0 0 0  Control/stop worrying 0 0 0 0  Worry too much - different things 0 0 0 0  Trouble relaxing 0 0 0 0  Restless 0 0 0 0  Easily annoyed or irritable 0 0 0 0  Afraid - awful might happen 0 0 0 0  Total GAD 7 Score 0 0 0 0  Anxiety Difficulty Not difficult at all Not difficult at all Not difficult at all Not difficult at all       10/06/2024    3:16 PM 08/18/2024    8:58 AM 06/22/2024     9:23 AM  Depression screen PHQ 2/9  Decreased Interest 0 0 0  Down, Depressed, Hopeless 0 0 0  PHQ - 2 Score 0 0 0    BP Readings from Last 3 Encounters:  10/06/24 102/66  09/02/24 108/67  08/18/24 118/78    Wt Readings from Last 3 Encounters:  10/06/24 148 lb (67.1 kg)  09/02/24 147 lb (66.7 kg)  06/22/24 144 lb (65.3 kg)    BP 102/66   Pulse 83   Temp 98.7 F (37.1 C)   Ht 5' 3 (1.6 m)   Wt 148 lb (67.1 kg)   SpO2 97%   BMI 26.22 kg/m   Physical Exam Vitals and nursing note reviewed.  Constitutional:      Appearance: Normal appearance.  Cardiovascular:     Rate and Rhythm: Normal rate.  Pulmonary:     Effort: Pulmonary effort is normal.  Abdominal:     General: Bowel sounds are normal. There is no distension.     Palpations: Abdomen is soft.     Tenderness: There is abdominal tenderness in the right lower quadrant.  Musculoskeletal:        General: Normal range of motion.  Skin:    General: Skin is warm and dry.  Neurological:     Mental Status: She is alert and oriented to person, place, and time.     Gait: Gait is intact.  Psychiatric:        Mood and Affect: Mood and affect normal.     Recent Labs     Component Value Date/Time   NA 134 06/22/2024 1013   K 4.4 06/22/2024 1013   CL 101 06/22/2024 1013   CO2 21 06/22/2024 1013   GLUCOSE 84 06/22/2024 1013   GLUCOSE 125 (H) 04/23/2023 2149   BUN 16 06/22/2024 1013   CREATININE 0.70 06/22/2024 1013   CALCIUM 9.2 06/22/2024 1013   PROT 6.6 06/22/2024 1013   ALBUMIN 4.2 06/22/2024 1013   AST 19 06/22/2024 1013   ALT 17 06/22/2024 1013   ALKPHOS 60 06/22/2024 1013   BILITOT 0.3 06/22/2024 1013   GFRNONAA >60 04/23/2023 2149    Lab Results  Component Value Date   WBC 6.2 06/22/2024   HGB 12.6 06/22/2024   HCT 39.1 06/22/2024   MCV 90 06/22/2024   PLT 264 06/22/2024   No results found for: HGBA1C Lab Results  Component Value Date   CHOL 204 (H) 08/21/2023   HDL 52 08/21/2023    LDLCALC 129 (H) 08/21/2023   TRIG 130 08/21/2023   Lab Results  Component Value Date   TSH 1.970 06/22/2024      Assessment and Plan:  Abdominal bloating Assessment & Plan: Routine labs completed recently and normal.  Check  for food allergies, alpha gal, celiac.  Patient states she thinks her mom might have celiac disease but eats gluten anyways.   Also encouraged to start docusate stool softener daily to promote regular smooth bowel movements.  Discussed possibility of IBS though this is a diagnosis of exclusion.  Considered trial of dicyclomine, but I am not sure how much this would help her if she has alternating bowel patterns.  Will hold off on this for now.  If all testing is negative, we will likely be referring to GI for further evaluation.  Orders: -     Food Allergy Profile -     Alpha-Gal Panel -     Tissue transglutaminase, IgA  Vitamin D  deficiency Assessment & Plan: Refill prescription strength vitamin D .  Orders: -     Cholecalciferol ; Take 1 capsule (50,000 Units total) by mouth once a week.  Dispense: 12 capsule; Refill: 1     No follow-ups on file.    Rolan Hoyle, PA-C, DMSc, Nutritionist [facility] [phone]

## 2024-10-06 NOTE — Assessment & Plan Note (Signed)
 Refill prescription strength vitamin D .

## 2024-10-06 NOTE — Telephone Encounter (Signed)
 I tried to reach back out to patient. She did not answer.

## 2024-10-06 NOTE — Assessment & Plan Note (Addendum)
 Routine labs completed recently and normal.  Check for food allergies, alpha gal, celiac.  Patient states she thinks her mom might have celiac disease but eats gluten anyways.   Also encouraged to start docusate stool softener daily to promote regular smooth bowel movements.  Discussed possibility of IBS though this is a diagnosis of exclusion.  Considered trial of dicyclomine, but I am not sure how much this would help her if she has alternating bowel patterns.  Will hold off on this for now.  If all testing is negative, we will likely be referring to GI for further evaluation.

## 2024-10-09 LAB — ALLERGY PROFILE, FOOD
Allergen Salmon IgE: <0.1 kU/L
Codfish IgE: <0.1 kU/L
F001-IgE Egg White: <0.1 kU/L
F002-IgE Milk: <0.1 kU/L
F004-IgE Wheat: <0.1 kU/L
F010-IgE Sesame Seed: <0.1 kU/L
F017-IgE Hazelnut (Filbert): <0.1 kU/L
F018-IgE Brazil Nut: <0.1 kU/L
F020-IgE Almond: <0.1 kU/L
F202-IgE Cashew Nut: <0.1 kU/L
F256-IgE Walnut: <0.1 kU/L
F416-IgE Tri a 19(w-5 gliadin): <0.1 kU/L
Peanut, IgE: <0.1 kU/L
Scallop IgE: <0.1 kU/L
Shrimp IgE: <0.1 kU/L
Soybean IgE: <0.1 kU/L
Tuna: <0.1 kU/L

## 2024-10-09 LAB — ALPHA-GAL PANEL
Allergen Lamb IgE: <0.1 kU/L
Beef IgE: <0.1 kU/L
IgE (Immunoglobulin E), Serum: <2 [IU]/mL — ABNORMAL LOW (ref 6–495)
O215-IgE Alpha-Gal: <0.1 kU/L
Pork IgE: <0.1 kU/L

## 2024-10-09 LAB — TISSUE TRANSGLUTAMINASE, IGA: t-Transglutaminase (tTG) IgA: <2 U/mL (ref 0–3)

## 2024-10-11 ENCOUNTER — Other Ambulatory Visit: Payer: Self-pay | Admitting: Physician Assistant

## 2024-10-11 ENCOUNTER — Ambulatory Visit: Payer: Self-pay | Admitting: Physician Assistant

## 2024-10-11 DIAGNOSIS — R140 Abdominal distension (gaseous): Secondary | ICD-10-CM

## 2024-10-12 LAB — SPECIMEN STATUS REPORT

## 2024-10-12 LAB — B12 AND FOLATE PANEL
Folate: 11 ng/mL (ref >3.0)
Vitamin B-12: 324 pg/mL (ref 232–1245)

## 2024-11-05 ENCOUNTER — Ambulatory Visit
Admission: RE | Admit: 2024-11-05 | Discharge: 2024-11-05 | Disposition: A | Source: Ambulatory Visit | Attending: Physician Assistant | Admitting: Physician Assistant

## 2024-11-05 ENCOUNTER — Encounter: Payer: Self-pay | Admitting: Physician Assistant

## 2024-11-05 ENCOUNTER — Other Ambulatory Visit: Payer: Self-pay | Admitting: Physician Assistant

## 2024-11-05 DIAGNOSIS — M79662 Pain in left lower leg: Secondary | ICD-10-CM

## 2024-12-15 NOTE — Telephone Encounter (Signed)
 Please review.  KP

## 2024-12-28 ENCOUNTER — Ambulatory Visit
Admission: RE | Admit: 2024-12-28 | Discharge: 2024-12-28 | Disposition: A | Source: Ambulatory Visit | Attending: Gastroenterology | Admitting: Gastroenterology

## 2024-12-28 ENCOUNTER — Telehealth: Admitting: Gastroenterology

## 2024-12-28 ENCOUNTER — Ambulatory Visit
Admission: RE | Admit: 2024-12-28 | Discharge: 2024-12-28 | Disposition: A | Source: Home / Self Care | Attending: Gastroenterology | Admitting: Gastroenterology

## 2024-12-28 ENCOUNTER — Encounter: Payer: Self-pay | Admitting: Gastroenterology

## 2024-12-28 VITALS — Wt 149.0 lb

## 2024-12-28 DIAGNOSIS — R143 Flatulence: Secondary | ICD-10-CM

## 2024-12-28 DIAGNOSIS — R140 Abdominal distension (gaseous): Secondary | ICD-10-CM

## 2024-12-28 NOTE — Addendum Note (Signed)
 Addended by: LANNIE BRAULIO GRADE on: 12/28/2024 10:08 AM   Modules accepted: Orders

## 2024-12-30 ENCOUNTER — Ambulatory Visit: Payer: Self-pay | Admitting: Gastroenterology

## 2025-02-03 ENCOUNTER — Ambulatory Visit: Admitting: Gastroenterology

## 2025-08-19 ENCOUNTER — Encounter: Admitting: Physician Assistant
